# Patient Record
Sex: Female | Born: 1960
Health system: Southern US, Community
[De-identification: ages and names within clinical notes are randomized; demographics above are authoritative.]

## PROBLEM LIST (undated history)

## (undated) ENCOUNTER — Ambulatory Visit: Disposition: A | Discharge status: Home / Self Care | Payer: BC Managed Care – PPO

## (undated) DIAGNOSIS — I10 Essential (primary) hypertension: Secondary | ICD-10-CM

## (undated) DIAGNOSIS — R630 Anorexia: Secondary | ICD-10-CM

## (undated) DIAGNOSIS — E785 Hyperlipidemia, unspecified: Secondary | ICD-10-CM

## (undated) DIAGNOSIS — E079 Disorder of thyroid, unspecified: Secondary | ICD-10-CM

## (undated) DIAGNOSIS — R55 Syncope and collapse: Secondary | ICD-10-CM

## (undated) HISTORY — DX: Anorexia: R63.0

## (undated) HISTORY — DX: Hyperlipidemia, unspecified: E78.5

## (undated) HISTORY — DX: Syncope and collapse: R55

---

## 1986-08-27 DIAGNOSIS — R630 Anorexia: Secondary | ICD-10-CM

## 1986-08-27 HISTORY — DX: Anorexia: R63.0

## 1987-08-28 HISTORY — PX: CHOLECYSTECTOMY: SHX55

## 2004-06-06 ENCOUNTER — Emergency Department: Payer: Self-pay | Admitting: Emergency Medicine

## 2004-10-06 ENCOUNTER — Ambulatory Visit: Payer: Self-pay

## 2005-03-15 ENCOUNTER — Ambulatory Visit: Payer: Self-pay | Admitting: Internal Medicine

## 2005-10-10 ENCOUNTER — Ambulatory Visit: Payer: Self-pay | Admitting: Unknown Physician Specialty

## 2005-11-07 ENCOUNTER — Ambulatory Visit: Payer: Self-pay

## 2006-05-15 ENCOUNTER — Ambulatory Visit: Payer: Self-pay | Admitting: Internal Medicine

## 2008-07-04 ENCOUNTER — Emergency Department: Payer: Self-pay | Admitting: Internal Medicine

## 2008-08-17 ENCOUNTER — Ambulatory Visit: Payer: Self-pay | Admitting: Internal Medicine

## 2008-11-01 ENCOUNTER — Emergency Department: Payer: Self-pay | Admitting: Emergency Medicine

## 2009-07-11 ENCOUNTER — Ambulatory Visit: Payer: Self-pay | Admitting: Internal Medicine

## 2009-07-13 ENCOUNTER — Ambulatory Visit: Payer: Self-pay | Admitting: Internal Medicine

## 2009-07-16 ENCOUNTER — Ambulatory Visit: Payer: Self-pay | Admitting: Specialist

## 2009-08-10 ENCOUNTER — Ambulatory Visit: Payer: Self-pay | Admitting: Specialist

## 2009-08-25 ENCOUNTER — Ambulatory Visit: Payer: Self-pay | Admitting: Specialist

## 2010-08-27 HISTORY — PX: ABDOMINAL HYSTERECTOMY: SHX81

## 2010-08-27 HISTORY — PX: ROTATOR CUFF REPAIR: SHX139

## 2010-10-12 ENCOUNTER — Ambulatory Visit: Payer: Self-pay | Admitting: Internal Medicine

## 2010-10-20 ENCOUNTER — Ambulatory Visit: Payer: Self-pay | Admitting: Internal Medicine

## 2010-11-09 ENCOUNTER — Emergency Department: Payer: Self-pay | Admitting: Emergency Medicine

## 2010-11-26 ENCOUNTER — Ambulatory Visit: Payer: Self-pay | Admitting: Internal Medicine

## 2010-11-28 ENCOUNTER — Emergency Department: Payer: Self-pay | Admitting: Emergency Medicine

## 2010-11-30 ENCOUNTER — Ambulatory Visit: Payer: Self-pay

## 2010-12-07 ENCOUNTER — Ambulatory Visit: Payer: Self-pay

## 2010-12-27 ENCOUNTER — Emergency Department: Payer: Self-pay | Admitting: Emergency Medicine

## 2012-03-11 ENCOUNTER — Ambulatory Visit: Payer: Self-pay | Admitting: Family Medicine

## 2012-06-30 ENCOUNTER — Ambulatory Visit: Payer: Self-pay | Admitting: Specialist

## 2012-07-04 ENCOUNTER — Ambulatory Visit: Payer: Self-pay | Admitting: Specialist

## 2013-03-12 ENCOUNTER — Ambulatory Visit: Payer: Self-pay | Admitting: Family Medicine

## 2014-08-27 DIAGNOSIS — R55 Syncope and collapse: Secondary | ICD-10-CM

## 2014-08-27 HISTORY — DX: Syncope and collapse: R55

## 2014-12-14 NOTE — Op Note (Signed)
PATIENT NAME:  Allison Coffey, Allison Coffey MR#:  [id number] DATE OF BIRTH:  Aug 09, 1961  DATE OF PROCEDURE:  07/04/2012  PREOPERATIVE DIAGNOSIS: Severe degenerative arthritis, first metacarpocarpal joint, left thumb.   POSTOPERATIVE DIAGNOSIS: Severe degenerative arthritis, first metacarpocarpal joint, left thumb.   PROCEDURE PERFORMED: Excisional trapezium arthroplasty, base of left thumb.   SURGEON: Myra Rudehristopher Arryana Tolleson, MD  ANESTHESIA: General.   COMPLICATIONS: None.   TOURNIQUET TIME: Approximately 60 minutes.   DESCRIPTION OF PROCEDURE: One gram of Ancef was given intravenously prior to the procedure. General anesthesia is induced. The left upper extremity is thoroughly prepped with alcohol and ChloraPrep and draped in standard sterile fashion. The extremity is wrapped out with the Esmarch bandage and pneumatic tourniquet elevated to 250 mmHg. Under loupe magnification, standard dorsal longitudinal incision is made over the base of the thumb, at the first metacarpocarpal joint. The dissection is carefully carried down to the dorsal joint capsule with preservation of the tendons and cutaneous nerves which are retracted to each side. The capsule is then incised longitudinally and carefully reflected off of the bone and preserved. The trapezium is then outlined using the freer elevator and using the bur and rongeur is completely excised. The wound is thoroughly irrigated multiple times. Three small pieces of Gelfoam are then compressed into the shape of a trapezium and sewn together with 4-0 Mersilene. This was then sewn into the base of the wound and the tendon and pushed into the empty space and secured. Capsule is then meticulously repaired dorsally using 4-0 Mersilene. The skin edges are      infiltrated with 0.5% plain Marcaine. The skin is closed with a running subcuticular 3-0 Prolene. A soft bulky dressing is applied with a thumb spica splint. The tourniquet is released and the patient is returned  to the recovery room in satisfactory condition having tolerated the procedure quite well.  ____________________________ Clare Gandyhristopher E. Adekunle Rohrbach, MD ces:slb D: 07/04/2012 09:15:46 ET T: 07/04/2012 11:21:01 ET JOB#: [id number]  cc: Clare Gandyhristopher E. Zerick Prevette, MD, <Dictator> Clare GandyHRISTOPHER E Aundria Bitterman MD ELECTRONICALLY SIGNED 07/08/2012 9:45

## 2015-01-19 ENCOUNTER — Ambulatory Visit (INDEPENDENT_AMBULATORY_CARE_PROVIDER_SITE_OTHER): Payer: 59 | Admitting: Internal Medicine

## 2015-01-19 ENCOUNTER — Encounter: Payer: Self-pay | Admitting: Internal Medicine

## 2015-01-19 VITALS — BP 126/78 | HR 55 | Temp 97.7°F | Resp 14 | Ht 64.5 in | Wt 143.5 lb

## 2015-01-19 DIAGNOSIS — T730XXS Starvation, sequela: Secondary | ICD-10-CM

## 2015-01-19 DIAGNOSIS — F5102 Adjustment insomnia: Secondary | ICD-10-CM

## 2015-01-19 DIAGNOSIS — Z1159 Encounter for screening for other viral diseases: Secondary | ICD-10-CM

## 2015-01-19 DIAGNOSIS — E038 Other specified hypothyroidism: Secondary | ICD-10-CM | POA: Diagnosis not present

## 2015-01-19 DIAGNOSIS — E46 Unspecified protein-calorie malnutrition: Secondary | ICD-10-CM

## 2015-01-19 DIAGNOSIS — E034 Atrophy of thyroid (acquired): Secondary | ICD-10-CM

## 2015-01-19 DIAGNOSIS — F509 Eating disorder, unspecified: Secondary | ICD-10-CM

## 2015-01-19 DIAGNOSIS — F5001 Anorexia nervosa, restricting type: Secondary | ICD-10-CM

## 2015-01-19 NOTE — Progress Notes (Signed)
Pre-visit discussion using our clinic review tool. No additional management support is needed unless otherwise documented below in the visit note.  

## 2015-01-19 NOTE — Patient Instructions (Signed)
You need to intake calories every 3 hours while awake.  You are in starvation mode currently the way you have been managing your diet  Stick with Nichelle's eating plan.  Drink a Insurance account managerremier Protein shake if you do't feel hungry  Melatonin 5 mg with dinner  Glass of wine with dinner   Stop the laxatives,  Ok to use metamucil or similar bulk forming laxative
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
## 2015-01-20 LAB — COMPREHENSIVE METABOLIC PANEL
ALBUMIN: 4.2 g/dL (ref 3.5–5.2)
ALT: 16 U/L (ref 0–35)
AST: 23 U/L (ref 0–37)
Alkaline Phosphatase: 52 U/L (ref 39–117)
BUN: 16 mg/dL (ref 6–23)
CALCIUM: 9 mg/dL (ref 8.4–10.5)
CHLORIDE: 102 meq/L (ref 96–112)
CO2: 28 mEq/L (ref 19–32)
Creatinine, Ser: 0.76 mg/dL (ref 0.40–1.20)
GFR: 84.28 mL/min (ref >60.00)
Glucose, Bld: 78 mg/dL (ref 70–99)
Potassium: 4.1 mEq/L (ref 3.5–5.1)
SODIUM: 136 meq/L (ref 135–145)
Total Bilirubin: 0.6 mg/dL (ref 0.2–1.2)
Total Protein: 6.6 g/dL (ref 6.0–8.3)

## 2015-01-20 LAB — LIPID PANEL
CHOLESTEROL: 199 mg/dL (ref 0–200)
HDL: 94.8 mg/dL (ref >39.00)
LDL CALC: 92 mg/dL (ref 0–99)
NonHDL: 104.2
TRIGLYCERIDES: 63 mg/dL (ref 0.0–149.0)
Total CHOL/HDL Ratio: 2
VLDL: 12.6 mg/dL (ref 0.0–40.0)

## 2015-01-20 LAB — TSH: TSH: 2.37 u[IU]/mL (ref 0.35–4.50)

## 2015-01-20 LAB — CBC WITH DIFFERENTIAL/PLATELET
Basophils Absolute: 0 10*3/uL (ref 0.0–0.1)
Basophils Relative: 0.4 % (ref 0.0–3.0)
EOS ABS: 0.2 10*3/uL (ref 0.0–0.7)
EOS PCT: 3 % (ref 0.0–5.0)
HCT: 43.7 % (ref 36.0–46.0)
Hemoglobin: 14.6 g/dL (ref 12.0–15.0)
Lymphocytes Relative: 23.4 % (ref 12.0–46.0)
Lymphs Abs: 1.7 10*3/uL (ref 0.7–4.0)
MCHC: 33.4 g/dL (ref 30.0–36.0)
MCV: 96.8 fl (ref 78.0–100.0)
Monocytes Absolute: 0.5 10*3/uL (ref 0.1–1.0)
Monocytes Relative: 6.8 % (ref 3.0–12.0)
Neutro Abs: 4.8 10*3/uL (ref 1.4–7.7)
Neutrophils Relative %: 66.4 % (ref 43.0–77.0)
PLATELETS: 225 10*3/uL (ref 150.0–400.0)
RBC: 4.51 Mil/uL (ref 3.87–5.11)
RDW: 13.9 % (ref 11.5–15.5)
WBC: 7.3 10*3/uL (ref 4.0–10.5)

## 2015-01-20 LAB — FOLATE RBC: RBC Folate: 675 ng/mL (ref >280)

## 2015-01-20 LAB — HEPATITIS C ANTIBODY: HCV Ab: NEGATIVE

## 2015-01-20 LAB — VITAMIN B12: VITAMIN B 12: 264 pg/mL (ref 211–911)

## 2015-01-23 ENCOUNTER — Encounter: Payer: Self-pay | Admitting: Internal Medicine

## 2015-01-23 DIAGNOSIS — F50014 Anorexia nervosa, restricting type, in remission: Secondary | ICD-10-CM | POA: Insufficient documentation

## 2015-01-23 DIAGNOSIS — F509 Eating disorder, unspecified: Secondary | ICD-10-CM | POA: Insufficient documentation

## 2015-01-23 DIAGNOSIS — F5001 Anorexia nervosa, restricting type: Secondary | ICD-10-CM | POA: Insufficient documentation

## 2015-01-23 DIAGNOSIS — E039 Hypothyroidism, unspecified: Secondary | ICD-10-CM | POA: Insufficient documentation

## 2015-01-23 DIAGNOSIS — F5102 Adjustment insomnia: Secondary | ICD-10-CM | POA: Insufficient documentation

## 2015-01-23 NOTE — Assessment & Plan Note (Signed)
Long discussion with patient about her history ,  Current symptoms and signs , ongoing laxative abuse, etc.  Recommendations made to increaese caloric intake to at least 800 mg dail,  Stop fasting and , discontinue the stimulant laxative abuse

## 2015-01-23 NOTE — Assessment & Plan Note (Signed)
.  Thyroid function is WNL on current dose.  No current changes needed.   Lab Results  Component Value Date   TSH 2.37 01/19/2015

## 2015-01-23 NOTE — Assessment & Plan Note (Signed)
Chronic, no previous trial of melatonin.  Advised to try melatonin and encouragged to have a glass of wine with dinner

## 2015-01-23 NOTE — Assessment & Plan Note (Addendum)
Self induced from her unacknowledged eating disorder.  She has been refusing to accept the diagnosis ,  And I am the 3rd or 4th physician  Who has advised her in the last 2 years .  Long discussion today about her caloric needs, how her goal BMI is not unreasonable  But her method is both ineffective and unsafe.  Lab Results  Component Value Date   ALT 16 01/19/2015   AST 23 01/19/2015   ALKPHOS 52 01/19/2015   BILITOT 0.6 01/19/2015

## 2015-01-24 ENCOUNTER — Telehealth: Payer: Self-pay | Admitting: Internal Medicine

## 2015-01-24 NOTE — Telephone Encounter (Signed)
My chart message sent to patient re low carb diet

## 2015-02-11 NOTE — Telephone Encounter (Signed)
Mailed unread message to pt  

## 2015-02-14 ENCOUNTER — Encounter: Payer: Self-pay | Admitting: Internal Medicine

## 2015-02-14 DIAGNOSIS — R140 Abdominal distension (gaseous): Secondary | ICD-10-CM

## 2015-02-24 ENCOUNTER — Ambulatory Visit
Admission: RE | Admit: 2015-02-24 | Discharge: 2015-02-24 | Disposition: A | Discharge status: Home / Self Care | Payer: 59 | Source: Ambulatory Visit | Attending: Internal Medicine | Admitting: Internal Medicine

## 2015-02-24 DIAGNOSIS — R140 Abdominal distension (gaseous): Secondary | ICD-10-CM | POA: Insufficient documentation

## 2015-02-24 MED ORDER — TECHNETIUM TC 99M SULFUR COLLOID
2.0000 | Freq: Once | INTRAVENOUS | Status: AC | PRN
  Administered 2015-02-24: 2.175 via INTRAVENOUS

## 2015-03-28 ENCOUNTER — Encounter: Payer: Self-pay | Admitting: Internal Medicine

## 2015-05-18 ENCOUNTER — Telehealth: Payer: Self-pay | Admitting: Internal Medicine

## 2015-05-18 MED ORDER — PREDNISONE 10 MG PO TABS: ORAL_TABLET | ORAL | Status: DC

## 2015-05-18 MED ORDER — LEVOFLOXACIN 500 MG PO TABS: 500.0000 mg | ORAL_TABLET | Freq: Every day | ORAL | Status: DC

## 2015-05-18 NOTE — Telephone Encounter (Signed)
I am treating you for sinusitis   I am prescribing an antibiotic (levaquin) and a prednisone taper  To manage the infection and the inflammation in your ear/sinuses.   I also advise use of the following OTC meds to help with your other symptoms.   Take generic OTC benadryl 25 mg every 8 hours for the drainage,  Sudafed PE  10 to 30 mg every 8 hours for the congestion, you may substitute Afrin nasal spray for the nighttime dose of sudafed PE  If needed to prevent insomnia.  flush your sinuses twice daily with Lloyd Huger Meds sinus rinse (do over the sink because if you do it right you will spit out globs of mucus)  Gargle with salt water as needed for sore throat.     Please take a probiotic ( Align, Floraque or Culturelle) of the generic version of one of these  For a minimum of 3 weeks to prevent a serious antibiotic associated diarrhea  Called clostridium dificile colitis

## 2015-07-27 ENCOUNTER — Ambulatory Visit (INDEPENDENT_AMBULATORY_CARE_PROVIDER_SITE_OTHER): Payer: 59 | Admitting: Internal Medicine

## 2015-07-27 VITALS — BP 124/72 | HR 77 | Temp 98.0°F | Wt 140.0 lb

## 2015-07-27 DIAGNOSIS — F5001 Anorexia nervosa, restricting type: Secondary | ICD-10-CM

## 2015-07-27 DIAGNOSIS — E038 Other specified hypothyroidism: Secondary | ICD-10-CM

## 2015-07-27 DIAGNOSIS — F5000 Anorexia nervosa, unspecified: Secondary | ICD-10-CM

## 2015-07-27 DIAGNOSIS — E034 Atrophy of thyroid (acquired): Secondary | ICD-10-CM | POA: Diagnosis not present

## 2015-07-27 MED ORDER — ESZOPICLONE 2 MG PO TABS: 2.0000 mg | ORAL_TABLET | Freq: Every evening | ORAL | Status: DC | PRN

## 2015-07-27 MED ORDER — LEVOTHYROXINE SODIUM 75 MCG PO TABS: 75.0000 ug | ORAL_TABLET | Freq: Every day | ORAL | Status: DC

## 2015-07-27 MED ORDER — ESTROGENS CONJUGATED 0.3 MG PO TABS: 0.3000 mg | ORAL_TABLET | Freq: Every day | ORAL | Status: DC

## 2015-07-27 NOTE — Patient Instructions (Signed)
Eszopiclone tablets What is this medicine? ESZOPICLONE (es ZOE pi clone) is used to treat insomnia. This medicine helps you to fall asleep and sleep through the night. This medicine may be used for other purposes; ask your health care provider or pharmacist if you have questions. What should I tell my health care provider before I take this medicine? They need to know if you have any of these conditions: -depression -history of a drug or alcohol abuse problem -liver disease -lung or breathing disease -suicidal thoughts -an unusual or allergic reaction to eszopiclone, other medicines, foods, dyes, or preservatives -pregnant or trying to get pregnant -breast-feeding How should I use this medicine? Take this medicine by mouth with a glass of water. Follow the directions on the prescription label. It is better to take this medicine on an empty stomach and only when you are ready for bed. Do not take your medicine more often than directed. If you have been taking this medicine for several weeks and suddenly stop taking it, you may get unpleasant withdrawal symptoms. Your doctor or health care professional may want to gradually reduce the dose. Do not stop taking this medicine on your own. Always follow your doctor or health care professional's advice. Talk to your pediatrician regarding the use of this medicine in children. Special care may be needed. Overdosage: If you think you have taken too much of this medicine contact a poison control center or emergency room at once. NOTE: This medicine is only for you. Do not share this medicine with others. What if I miss a dose? This does not apply. This medicine should only be taken immediately before going to sleep. Do not take double or extra doses. What may interact with this medicine? -herbal medicines like kava kava, melatonin, St. John's wort and valerian -lorazepam -medicines for fungal infections like ketoconazole, fluconazole, or  itraconazole -olanzapine This list may not describe all possible interactions. Give your health care provider a list of all the medicines, herbs, non-prescription drugs, or dietary supplements you use. Also tell them if you smoke, drink alcohol, or use illegal drugs. Some items may interact with your medicine. What should I watch for while using this medicine? Visit your doctor or health care professional for regular checks on your progress. Keep a regular sleep schedule by going to bed at about the same time nightly. Avoid caffeine-containing drinks in the evening hours, as caffeine can cause trouble with falling asleep. Talk to your doctor if you still have trouble sleeping. After taking this medicine for sleep, you may get up out of bed while not being fully awake and do an activity that you do not know you are doing. The next morning, you may have no memory of the event. Activities such as driving a car ("sleep-driving"), making and eating food, talking on the phone, sexual activity, and sleep-walking have been reported. Call your doctor right away if you find out you have done any of these activities. Do not take this medicine if you have used alcohol that evening or before bed or taken another medicine for sleep, since your risk of doing these sleep-related activities will be increased. Do not take this medicine unless you are able to stay in bed for a full night (7 to 8 hours) before you must be active again. You may have a decrease in mental alertness the day after use, even if you feel that you are fully awake. Tell your doctor if you will need to perform activities requiring full   alertness, such as driving, the next day. Do not stand or sit up quickly after taking this medicine, especially if you are an older patient. This reduces the risk of dizzy or fainting spells. If you or your family notice any changes in your behavior, such as new or worsening depression, thoughts of harming yourself,  anxiety, other unusual or disturbing thoughts, or memory loss, call your doctor right away. After you stop taking this medicine, you may have trouble falling asleep. This is called rebound insomnia. This problem usually goes away on its own after 1 or 2 nights. What side effects may I notice from receiving this medicine? Side effects that you should report to your doctor or health care professional as soon as possible: -allergic reactions like skin rash, itching or hives, swelling of the face, lips, or tongue -changes in vision -confusion -depressed mood -feeling faint or lightheaded, falls -hallucinations -problems with balance, speaking, walking -restlessness, excitability, or feelings of agitation -unusual activities while asleep like driving, eating, making phone calls Side effects that usually do not require medical attention (report to your doctor or health care professional if they continue or are bothersome): -dizziness, or daytime drowsiness, sometimes called a hangover effect -headache This list may not describe all possible side effects. Call your doctor for medical advice about side effects. You may report side effects to FDA at 1-800-FDA-1088. Where should I keep my medicine? Keep out of the reach of children. This medicine can be abused. Keep your medicine in a safe place to protect it from theft. Do not share this medicine with anyone. Selling or giving away this medicine is dangerous and against the law. This medicine may cause accidental overdose and death if taken by other adults, children, or pets. Mix any unused medicine with a substance like cat litter or coffee grounds. Then throw the medicine away in a sealed container like a sealed bag or a coffee can with a lid. Do not use the medicine after the expiration date. Store at room temperature between 15 and 30 degrees C (59 and 86 degrees F). NOTE: This sheet is a summary. It may not cover all possible information. If you  have questions about this medicine, talk to your doctor, pharmacist, or health care provider.    2016, Elsevier/Gold Standard. (2014-05-04 15:22:01)  

## 2015-07-27 NOTE — Progress Notes (Signed)
Subjective:  Patient ID: Allison Coffey, female    DOB: 09/19/1960  Age: 54 y.o. MRN: [id number]  CC: The primary encounter diagnosis was Anorexia nervosa. Diagnoses of Anorexia nervosa,restricting type, in partial remission, severe and Hypothyroidism due to acquired atrophy of thyroid were also pertinent to this visit.  HPI Allison Coffey presents for follow up on anorexia nervosa. She was last seen  On May 25, as a new patient, at which time she had requested appetite suppressants to help her lose weight.  She had been told by other physicians priro toher visit that she had an eating disorder and was advsied by me of the same.  She had been restricting her daily caloric intake to < 500 calories and was concerned about a recurrent symptoms of abdominal distension and bloating after minimal intake of food.   She was sent for a gastric emptying study which was normal. She was advised to change her diet , specifically to increase her food intake to 3 meals daily (formerly eating only once daily) .   She is accompanied by her daughter, who agrees that her eating habits, although somewhat improved ,  Are still abnormal.  She has lost 3 lbs since her last visit.   Outpatient Prescriptions Prior to Visit  Medication Sig Dispense Refill  . Probiotic Product (PROBIOTIC ACIDOPHILUS) CAPS Take 1 capsule by mouth daily.    Marland Kitchen. levofloxacin (LEVAQUIN) 500 MG tablet Take 1 tablet (500 mg total) by mouth daily. (Patient not taking: Reported on 07/27/2015) 7 tablet 0  . levothyroxine (SYNTHROID, LEVOTHROID) 75 MCG tablet 75 mcg daily before breakfast.     . predniSONE (DELTASONE) 10 MG tablet 6 tablets on Day 1 , then reduce by 1 tablet daily until gone (Patient not taking: Reported on 07/27/2015) 21 tablet 0   No facility-administered medications prior to visit.    Review of Systems;  Patient denies headache, fevers, malaise, unintentional weight loss, skin rash, eye pain, sinus congestion and sinus  pain, sore throat, dysphagia,  hemoptysis , cough, dyspnea, wheezing, chest pain, palpitations, orthopnea, edema, abdominal pain, nausea, melena, diarrhea, constipation, flank pain, dysuria, hematuria, urinary  Frequency, nocturia, numbness, tingling, seizures,  Focal weakness, Loss of consciousness,  Tremor, insomnia, depression, anxiety, and suicidal ideation.      Objective:  BP 124/72 mmHg  Pulse 77  Temp(Src) 98 F (36.7 C) (Oral)  Wt 140 lb (63.504 kg)  SpO2 98%  BP Readings from Last 3 Encounters:  07/27/15 124/72  01/19/15 126/78    Wt Readings from Last 3 Encounters:  07/27/15 140 lb (63.504 kg)  01/19/15 143 lb 8 oz (65.091 kg)    General appearance: alert, cooperative and appears stated age Ears: normal TM's and external ear canals both ears Throat: lips, mucosa, and tongue normal; teeth and gums normal Neck: no adenopathy, no carotid bruit, supple, symmetrical, trachea midline and thyroid not enlarged, symmetric, no tenderness/mass/nodules Back: symmetric, no curvature. ROM normal. No CVA tenderness. Lungs: clear to auscultation bilaterally Heart: regular rate and rhythm, S1, S2 normal, no murmur, click, rub or gallop Abdomen: soft, non-tender; bowel sounds normal; no masses,  no organomegaly Pulses: 2+ and symmetric Skin: Skin color, texture, turgor normal. No rashes or lesions Lymph nodes: Cervical, supraclavicular, and axillary nodes normal.  No results found for: HGBA1C  Lab Results  Component Value Date   CREATININE 0.76 01/19/2015    Lab Results  Component Value Date   WBC 7.3 01/19/2015   HGB 14.6 01/19/2015  HCT 43.7 01/19/2015   PLT 225.0 01/19/2015   GLUCOSE 78 01/19/2015   CHOL 199 01/19/2015   TRIG 63.0 01/19/2015   HDL 94.80 01/19/2015   LDLCALC 92 01/19/2015   ALT 16 01/19/2015   AST 23 01/19/2015   NA 136 01/19/2015   K 4.1 01/19/2015   CL 102 01/19/2015   CREATININE 0.76 01/19/2015   BUN 16 01/19/2015   CO2 28 01/19/2015   TSH  2.37 01/19/2015    Nm Gastric Emptying  02/24/2015  CLINICAL DATA:  Postprandial distension EXAM: NUCLEAR MEDICINE GASTRIC EMPTYING SCAN TECHNIQUE: After oral ingestion of radiolabeled meal, sequential abdominal images were obtained for 4 hours. Percentage of activity emptying the stomach was calculated at 1 hour, 2 hour, 3 hour, and 4 hours. RADIOPHARMACEUTICALS:  2.175 mCi mCi Tc-36m MDP labeled sulfur colloid in egg orally COMPARISON:  None. FINDINGS: Expected location of the stomach in the left upper quadrant. Ingested meal empties the stomach gradually over the course of the study. 36% emptied at 1 hr ( normal >= 10%) 66% emptied at 2 hr ( normal >= 40%) 87% emptied at 3 hr ( normal >= 70%) 96% emptied at 4 hr ( normal >= 90%) IMPRESSION: Normal gastric emptying study for solid material. Electronically Signed   By: Bretta Bang III M.D.   On: 02/24/2015 11:57    Assessment & Plan:   Problem List Items Addressed This Visit    Anorexia nervosa,restricting type, in partial remission, severe    She is losing weight despite eating more frequently because she is still restricting her calories.  She acknowledges that she has had an eating disorder and is interested in receiving counselling as she is miserable. Referral to [facility] eating disorders clinic.        Hypothyroidism    Thyroid function is WNL on current dose.  No current changes needed.   Lab Results  Component Value Date   TSH 2.37 01/19/2015         Relevant Medications   levothyroxine (SYNTHROID, LEVOTHROID) 75 MCG tablet    Other Visit Diagnoses    Anorexia nervosa    -  Primary    Relevant Orders    Amb Referral to Nutrition and Diabetic E       I have discontinued Ms. Stroh's predniSONE and levofloxacin. I have also changed her levothyroxine. Additionally, I am having her start on estrogens (conjugated) and eszopiclone. Lastly, I am having her maintain her PROBIOTIC ACIDOPHILUS.  Meds ordered this encounter    Medications  . estrogens, conjugated, (PREMARIN) 0.3 MG tablet    Sig: Take 1 tablet (0.3 mg total) by mouth daily.    Dispense:  30 tablet    Refill:  0  . levothyroxine (SYNTHROID, LEVOTHROID) 75 MCG tablet    Sig: Take 1 tablet (75 mcg total) by mouth daily before breakfast.    Dispense:  90 tablet    Refill:  1  . eszopiclone (LUNESTA) 2 MG TABS tablet    Sig: Take 1 tablet (2 mg total) by mouth at bedtime as needed for sleep. Take immediately before bedtime    Dispense:  30 tablet    Refill:  0    Medications Discontinued During This Encounter  Medication Reason  . levothyroxine (SYNTHROID, LEVOTHROID) 75 MCG tablet Reorder  . predniSONE (DELTASONE) 10 MG tablet   . levofloxacin (LEVAQUIN) 500 MG tablet    A total of 25 minutes of face to face time was spent with patient more than  half of which was spent in counselling about the above mentioned conditions  and coordination of care   Follow-up: No Follow-up on file.   Sherlene Shams, MD

## 2015-07-29 ENCOUNTER — Encounter: Payer: Self-pay | Admitting: Internal Medicine

## 2015-07-29 NOTE — Assessment & Plan Note (Signed)
She is losing weight despite eating more frequently because she is still restricting her calories.  She acknowledges that she has had an eating disorder and is interested in receiving counselling as she is miserable. [facility]

## 2015-07-29 NOTE — Assessment & Plan Note (Signed)
Thyroid function is WNL on current dose.  No current changes needed.   Lab Results  Component Value Date   TSH 2.37 01/19/2015

## 2015-08-15 ENCOUNTER — Ambulatory Visit (INDEPENDENT_AMBULATORY_CARE_PROVIDER_SITE_OTHER): Payer: 59 | Admitting: Internal Medicine

## 2015-08-15 ENCOUNTER — Telehealth: Payer: Self-pay | Admitting: Internal Medicine

## 2015-08-15 ENCOUNTER — Encounter: Payer: Self-pay | Admitting: Internal Medicine

## 2015-08-15 VITALS — BP 150/88 | HR 64 | Temp 97.9°F | Resp 14

## 2015-08-15 DIAGNOSIS — J0100 Acute maxillary sinusitis, unspecified: Secondary | ICD-10-CM | POA: Diagnosis not present

## 2015-08-15 DIAGNOSIS — R509 Fever, unspecified: Secondary | ICD-10-CM | POA: Diagnosis not present

## 2015-08-15 LAB — POCT INFLUENZA A/B
Influenza A, POC: NEGATIVE
Influenza B, POC: NEGATIVE

## 2015-08-15 MED ORDER — PROMETHAZINE HCL 12.5 MG PO TABS
12.5000 mg | ORAL_TABLET | Freq: Three times a day (TID) | ORAL | Status: DC | PRN
Start: 2015-08-15 — End: 2015-12-07

## 2015-08-15 MED ORDER — PREDNISONE 10 MG PO TABS: ORAL_TABLET | ORAL | Status: DC

## 2015-08-15 MED ORDER — LEVOFLOXACIN 500 MG PO TABS: 500.0000 mg | ORAL_TABLET | Freq: Every day | ORAL | Status: DC

## 2015-08-15 NOTE — Patient Instructions (Addendum)
I am treating you for bacterial sinusitis which is a complication from your viral infection due to  persistent sinus congestion.   I am prescribing an antibiotic (levaquin) and a prednisone taper  To manage the infection and the inflammation in your ear/sinuses.   I also advise use of the following OTC meds to help with your other symptoms.   Take generic OTC benadryl 25 mg every 8 hours for the drainage,  Sudafed if needed for congestion,   flush your sinuses twice daily with NeilMed sinus rinse  (do over the sink because if you do it right you will spit out globs of mucus)  Use Robitussin or Delsym   FOR THE DAYTIME COUGH.  Promethazine is needed for nausea,  It is sedating but less $$$ than Zofran.   Please take a probiotic ( Align, Floraque or Culturelle) OR A GENERIC EQUIVALENT for three weeks since you are taking an  antibiotic to prevent a very serious antibiotic associated infection  Called closttidium dificile colitis that can cause diarrhea, multi organ failure, sepsis and death if not managed.   I would plan on staying out of work until Wednesday as the earliest return date.

## 2015-08-15 NOTE — Telephone Encounter (Signed)
Patient scheduled for today. Dr. Darrick Huntsmanullo

## 2015-08-15 NOTE — Telephone Encounter (Signed)
Can you fit her in this morning with another provider for sinus infection?

## 2015-08-15 NOTE — Progress Notes (Signed)
Subjective:  Patient ID: Allison Coffey, female    DOB: 03-02-1961  Age: 54 y.o. MRN: [id number]  CC: The primary encounter diagnosis was Fever, unspecified. A diagnosis of Acute maxillary sinusitis, recurrence not specified was also pertinent to this visit.  HPI Allison Coffey presents for sinusitis  Symptoms of sinus congestion started  5 days ago.  She started using sudafed, ibuprofen and robitussin.  On Saturday developed headache and dizziness, flushed,  Presyncopal .  Yesterday was too nauseated to eat but drank Pedialyte .  This am drank a Boost  Has had a flu shot.    Outpatient Prescriptions Prior to Visit  Medication Sig Dispense Refill  . estrogens, conjugated, (PREMARIN) 0.3 MG tablet Take 1 tablet (0.3 mg total) by mouth daily. 30 tablet 0  . eszopiclone (LUNESTA) 2 MG TABS tablet Take 1 tablet (2 mg total) by mouth at bedtime as needed for sleep. Take immediately before bedtime 30 tablet 0  . levothyroxine (SYNTHROID, LEVOTHROID) 75 MCG tablet Take 1 tablet (75 mcg total) by mouth daily before breakfast. 90 tablet 1  . Probiotic Product (PROBIOTIC ACIDOPHILUS) CAPS Take 1 capsule by mouth daily. Reported on 08/15/2015     No facility-administered medications prior to visit.    Review of Systems;  Patient denies headache, fevers, malaise, unintentional weight loss, skin rash, eye pain, sinus congestion and sinus pain, sore throat, dysphagia,  hemoptysis , cough, dyspnea, wheezing, chest pain, palpitations, orthopnea, edema, abdominal pain, nausea, melena, diarrhea, constipation, flank pain, dysuria, hematuria, urinary  Frequency, nocturia, numbness, tingling, seizures,  Focal weakness, Loss of consciousness,  Tremor, insomnia, depression, anxiety, and suicidal ideation.      Objective:  BP 150/88 mmHg  Pulse 64  Temp(Src) 97.9 F (36.6 C) (Oral)  Resp 14  SpO2 98%  BP Readings from Last 3 Encounters:  08/15/15 150/88  07/27/15 124/72  01/19/15 126/78     Wt Readings from Last 3 Encounters:  07/27/15 140 lb (63.504 kg)  01/19/15 143 lb 8 oz (65.091 kg)    General appearance: alert, cooperative and appears stated age HEENT: Head: Normocephalic, without obvious abnormality, atraumatic, sinuses tender to percussion Eyes: conjunctivae/corneas clear. PERRL, EOM's intact. Fundi benign. Ears: normal TM's and external ear canals both ears Nose: Nares normal. Septum midline. Mucosa injected and red . Maxillary  sinus tenderness bilateral, no crusting or bleeding points Throat: lips, mucosa, and tongue normal; teeth and gums normal Lungs: clear to auscultation bilaterally Heart: regular rate and rhythm, S1, S2 normal, no murmur, click, rub or gallop Abdomen: soft, non-tender; bowel sounds normal; no masses,  no organomegaly Pulses: 2+ and symmetric Skin: Skin color, texture, turgor normal. No rashes or lesions Lymph nodes: Cervical, supraclavicular, and axillary nodes normal.  No results found for: HGBA1C  Lab Results  Component Value Date   CREATININE 0.76 01/19/2015    Lab Results  Component Value Date   WBC 7.3 01/19/2015   HGB 14.6 01/19/2015   HCT 43.7 01/19/2015   PLT 225.0 01/19/2015   GLUCOSE 78 01/19/2015   CHOL 199 01/19/2015   TRIG 63.0 01/19/2015   HDL 94.80 01/19/2015   LDLCALC 92 01/19/2015   ALT 16 01/19/2015   AST 23 01/19/2015   NA 136 01/19/2015   K 4.1 01/19/2015   CL 102 01/19/2015   CREATININE 0.76 01/19/2015   BUN 16 01/19/2015   CO2 28 01/19/2015   TSH 2.37 01/19/2015    Nm Gastric Emptying  02/24/2015  CLINICAL DATA:  Postprandial  distension EXAM: NUCLEAR MEDICINE GASTRIC EMPTYING SCAN TECHNIQUE: After oral ingestion of radiolabeled meal, sequential abdominal images were obtained for 4 hours. Percentage of activity emptying the stomach was calculated at 1 hour, 2 hour, 3 hour, and 4 hours. RADIOPHARMACEUTICALS:  2.175 mCi mCi Tc-6163m MDP labeled sulfur colloid in egg orally COMPARISON:  None.  FINDINGS: Expected location of the stomach in the left upper quadrant. Ingested meal empties the stomach gradually over the course of the study. 36% emptied at 1 hr ( normal >= 10%) 66% emptied at 2 hr ( normal >= 40%) 87% emptied at 3 hr ( normal >= 70%) 96% emptied at 4 hr ( normal >= 90%) IMPRESSION: Normal gastric emptying study for solid material. Electronically Signed   By: Bretta BangWilliam  Woodruff III M.D.   On: 02/24/2015 11:57    Assessment & Plan:   Problem List Items Addressed This Visit    Sinusitis, acute    Given chronicity of symptoms, development of facial pain and exam consistent with bacterial URI,  Will treat with empiric antibiotics, steroids, decongestants, and saline lavage.        Relevant Medications   phenylephrine (SUDAFED PE) 10 MG TABS tablet   Pseudoephedrine-DM-GG (ROBITUSSIN COLD & COUGH PO)   levofloxacin (LEVAQUIN) 500 MG tablet   promethazine (PHENERGAN) 12.5 MG tablet   predniSONE (DELTASONE) 10 MG tablet    Other Visit Diagnoses    Fever, unspecified    -  Primary    Relevant Orders    POCT Influenza A/B (Completed)       I am having Ms. Kauk start on levofloxacin and promethazine. I am also having her maintain her PROBIOTIC ACIDOPHILUS, estrogens (conjugated), levothyroxine, eszopiclone, phenylephrine, Pseudoephedrine-DM-GG (ROBITUSSIN COLD & COUGH PO), and predniSONE.  Meds ordered this encounter  Medications  . phenylephrine (SUDAFED PE) 10 MG TABS tablet    Sig: Take 10 mg by mouth every 4 (four) hours as needed.  . Pseudoephedrine-DM-GG (ROBITUSSIN COLD & COUGH PO)    Sig: Take 5 mLs by mouth every 6 (six) hours as needed.  Marland Kitchen. DISCONTD: predniSONE (DELTASONE) 10 MG tablet    Sig: 6 tablets on Day 1 , then reduce by 1 tablet daily until gone    Dispense:  21 tablet    Refill:  0  . levofloxacin (LEVAQUIN) 500 MG tablet    Sig: Take 1 tablet (500 mg total) by mouth daily.    Dispense:  7 tablet    Refill:  0  . promethazine (PHENERGAN) 12.5 MG  tablet    Sig: Take 1 tablet (12.5 mg total) by mouth every 8 (eight) hours as needed for nausea or vomiting.    Dispense:  20 tablet    Refill:  0  . predniSONE (DELTASONE) 10 MG tablet    Sig: 6 tablets on Day 1 , then reduce by 1 tablet daily until gone    Dispense:  21 tablet    Refill:  0    Medications Discontinued During This Encounter  Medication Reason  . predniSONE (DELTASONE) 10 MG tablet Reorder    Follow-up: No Follow-up on file.   Sherlene ShamsULLO, Odetta Forness L, MD

## 2015-08-16 DIAGNOSIS — J019 Acute sinusitis, unspecified: Secondary | ICD-10-CM | POA: Insufficient documentation

## 2015-08-16 NOTE — Assessment & Plan Note (Signed)
Given chronicity of symptoms, development of facial pain and exam consistent with bacterial URI,  Will treat with empiric antibiotics, steroids, decongestants, and saline lavage.  

## 2015-08-24 ENCOUNTER — Other Ambulatory Visit: Payer: Self-pay | Admitting: Internal Medicine

## 2015-08-24 NOTE — Telephone Encounter (Signed)
Prescription started in novermber with no refills, please advise for refill? Was this a trial?

## 2015-08-24 NOTE — Telephone Encounter (Signed)
Ok to refill,  Refill sent  

## 2015-11-14 ENCOUNTER — Encounter: Payer: Self-pay | Admitting: Internal Medicine

## 2015-12-07 ENCOUNTER — Ambulatory Visit (INDEPENDENT_AMBULATORY_CARE_PROVIDER_SITE_OTHER): Payer: Commercial Managed Care - HMO | Admitting: Internal Medicine

## 2015-12-07 ENCOUNTER — Other Ambulatory Visit (HOSPITAL_COMMUNITY)
Admission: RE | Admit: 2015-12-07 | Discharge: 2015-12-07 | Disposition: A | Discharge status: Home / Self Care | Payer: Commercial Managed Care - HMO | Source: Ambulatory Visit | Attending: Internal Medicine | Admitting: Internal Medicine

## 2015-12-07 ENCOUNTER — Encounter: Payer: Self-pay | Admitting: Internal Medicine

## 2015-12-07 VITALS — BP 124/82 | HR 73 | Temp 97.7°F | Resp 12 | Ht 64.0 in | Wt 148.8 lb

## 2015-12-07 DIAGNOSIS — Z1239 Encounter for other screening for malignant neoplasm of breast: Secondary | ICD-10-CM

## 2015-12-07 DIAGNOSIS — Z01411 Encounter for gynecological examination (general) (routine) with abnormal findings: Secondary | ICD-10-CM | POA: Insufficient documentation

## 2015-12-07 DIAGNOSIS — Z23 Encounter for immunization: Secondary | ICD-10-CM

## 2015-12-07 DIAGNOSIS — Z1151 Encounter for screening for human papillomavirus (HPV): Secondary | ICD-10-CM | POA: Insufficient documentation

## 2015-12-07 DIAGNOSIS — Z124 Encounter for screening for malignant neoplasm of cervix: Secondary | ICD-10-CM

## 2015-12-07 DIAGNOSIS — Z90711 Acquired absence of uterus with remaining cervical stump: Secondary | ICD-10-CM | POA: Diagnosis not present

## 2015-12-07 DIAGNOSIS — F5001 Anorexia nervosa, restricting type: Secondary | ICD-10-CM

## 2015-12-07 DIAGNOSIS — Z0000 Encounter for general adult medical examination without abnormal findings: Secondary | ICD-10-CM | POA: Diagnosis not present

## 2015-12-07 NOTE — Patient Instructions (Signed)

## 2015-12-07 NOTE — Progress Notes (Signed)
Pre-visit discussion using our clinic review tool. No additional management support is needed unless otherwise documented below in the visit note.  

## 2015-12-07 NOTE — Progress Notes (Signed)
Patient ID: Allison Coffey, female    DOB: 06-Sep-1960  Age: 55 y.o. MRN: [id number]  The patient is here for annual  wellness examination and management of other chronic and acute problems.   Last PAP over 5 years  ago,  Still has cervix PAP done today  Last mammogram 2014  Last colonoscopy over 5 years ago  No FH or polyps    Referred to [place] [facility] in late nOvember   The risk factors are reflected in the social history.  The roster of all physicians providing medical care to patient - is listed in the Snapshot section of the chart.  Activities of daily living:  The patient is 100% independent in all ADLs: dressing, toileting, feeding as well as independent mobility  Home safety : The patient has smoke detectors in the home. They wear seatbelts.  There are no firearms at home. There is no violence in the home.   There is no risks for hepatitis, STDs or HIV. There is no   history of blood transfusion. They have no travel history to infectious disease endemic areas of the world.  The patient has seen their dentist in the last six month. They have seen their eye doctor in the last year. They admit to slight hearing difficulty with regard to whispered voices and some television programs.  They have deferred audiologic testing in the last year.  They do not  have excessive sun exposure. Discussed the need for sun protection: hats, long sleeves and use of sunscreen if there is significant sun exposure.   Diet: the importance of a healthy diet is discussed. They do have a healthy diet.  The benefits of regular aerobic exercise were discussed. She walks 4 times per week ,  20 minutes.   Depression screen: there are no signs or vegative symptoms of depression- irritability, change in appetite, anhedonia, sadness/tearfullness.  Cognitive assessment: the patient manages all their financial and personal affairs and is actively engaged. They could relate day,date,year and events; recalled  2/3 objects at 3 minutes; performed clock-face test normally.  The following portions of the patient's history were reviewed and updated as appropriate: allergies, current medications, past family history, past medical history,  past surgical history, past social history  and problem list.  Visual acuity was not assessed per patient preference since she has regular follow up with her ophthalmologist. Hearing and body mass index were assessed and reviewed.   During the course of the visit the patient was educated and counseled about appropriate screening and preventive services including : fall prevention , diabetes screening, nutrition counseling, colorectal cancer screening, and recommended immunizations.    CC: The primary encounter diagnosis was S/P abdominal supracervical subtotal hysterectomy. Diagnoses of Cervical cancer screening, Breast cancer screening, Need for prophylactic vaccination with combined diphtheria-tetanus-pertussis (DTP) vaccine, Anorexia nervosa,restricting type, in partial remission, severe, and Visit for preventive health examination were also pertinent to this visit.   Disappointed at weight gain.  Exercising 6 days/week Still restricting calories to < 1000. Eating a Banana for breakfast    History Allison Coffey has a past medical history of Hyperlipidemia; Syncope (y-4); and Anorexia (1988).   She has past surgical history that includes Cholecystectomy (1989); Abdominal hysterectomy (2012); and Rotator cuff repair (Left, 2012).   Her family history includes Heart disease in her maternal grandmother, paternal grandfather, and paternal grandmother; Heart disease (age of onset: 60) in her mother; Stroke in her maternal grandmother; Stroke (age of onset: 70) in her mother.She  reports that she quit smoking about 20 years ago. She has never used smokeless tobacco. She reports that she drinks alcohol. She reports that she does not use illicit drugs.  Outpatient Prescriptions  Prior to Visit  Medication Sig Dispense Refill  . levothyroxine (SYNTHROID, LEVOTHROID) 75 MCG tablet Take 1 tablet (75 mcg total) by mouth daily before breakfast. 90 tablet 1  . eszopiclone (LUNESTA) 2 MG TABS tablet Take 1 tablet (2 mg total) by mouth at bedtime as needed for sleep. Take immediately before bedtime (Patient not taking: Reported on 12/07/2015) 30 tablet 0  . levofloxacin (LEVAQUIN) 500 MG tablet Take 1 tablet (500 mg total) by mouth daily. 7 tablet 0  . phenylephrine (SUDAFED PE) 10 MG TABS tablet Take 10 mg by mouth every 4 (four) hours as needed.    . predniSONE (DELTASONE) 10 MG tablet 6 tablets on Day 1 , then reduce by 1 tablet daily until gone 21 tablet 0  . PREMARIN 0.3 MG tablet TAKE 1 TABLET (0.3 MG TOTAL) BY MOUTH DAILY. 30 tablet 5  . Probiotic Product (PROBIOTIC ACIDOPHILUS) CAPS Take 1 capsule by mouth daily. Reported on 08/15/2015    . promethazine (PHENERGAN) 12.5 MG tablet Take 1 tablet (12.5 mg total) by mouth every 8 (eight) hours as needed for nausea or vomiting. 20 tablet 0  . Pseudoephedrine-DM-GG (ROBITUSSIN COLD & COUGH PO) Take 5 mLs by mouth every 6 (six) hours as needed.     No facility-administered medications prior to visit.    Review of Systems   Patient denies headache, fevers, malaise, unintentional weight loss, skin rash, eye pain, sinus congestion and sinus pain, sore throat, dysphagia,  hemoptysis , cough, dyspnea, wheezing, chest pain, palpitations, orthopnea, edema, abdominal pain, nausea, melena, diarrhea, constipation, flank pain, dysuria, hematuria, urinary  Frequency, nocturia, numbness, tingling, seizures,  Focal weakness, Loss of consciousness,  Tremor, insomnia, depression, anxiety, and suicidal ideation.      Objective:  BP 124/82 mmHg  Pulse 73  Temp(Src) 97.7 F (36.5 C) (Oral)  Resp 12  Ht  (1.626 m)  Wt 148 lb 12 oz (67.473 kg)  BMI 25.52 kg/m2  SpO2 98%  Physical Exam   General Appearance:    Alert, cooperative,  no distress, appears stated age  Head:    Normocephalic, without obvious abnormality, atraumatic  Eyes:    PERRL, conjunctiva/corneas clear, EOM's intact, fundi    benign, both eyes  Ears:    Normal TM's and external ear canals, both ears  Nose:   Nares normal, septum midline, mucosa normal, no drainage    or sinus tenderness  Throat:   Lips, mucosa, and tongue normal; teeth and gums normal  Neck:   Supple, symmetrical, trachea midline, no adenopathy;    thyroid:  no enlargement/tenderness/nodules; no carotid   bruit or JVD  Back:     Symmetric, no curvature, ROM normal, no CVA tenderness  Lungs:     Clear to auscultation bilaterally, respirations unlabored  Chest Wall:    No tenderness or deformity   Heart:    Regular rate and rhythm, S1 and S2 normal, no murmur, rub   or gallop  Breast Exam:    No tenderness, masses, or nipple abnormality  Abdomen:     Soft, non-tender, bowel sounds active all four quadrants,    no masses, no organomegaly  Genitalia:    Pelvic: cervix normal in appearance, external genitalia normal, no adnexal masses or tenderness, no cervical motion tenderness, rectovaginal septum normal, uterus  surgically absent  and vagina normal without discharge  Extremities:   Extremities normal, atraumatic, no cyanosis or edema  Pulses:   2+ and symmetric all extremities  Skin:   Skin color, texture, turgor normal, no rashes or lesions  Lymph nodes:   Cervical, supraclavicular, and axillary nodes normal  Neurologic:   CNII-XII intact, normal strength, sensation and reflexes    throughout    .    Assessment & Plan:   Problem List Items Addressed This Visit    Anorexia nervosa,restricting type, in partial remission, severe    [facility] and patient never received a call.  She is very interested in getting help for her eating disorder.       Visit for preventive health examination    Annual wellness  exam was done as well as  a comprehensive physical exam and management of acute and chronic conditions .  During the course of the visit the patient was educated and counseled about appropriate screening and preventive services including :  diabetes screening, lipid analysis with projected  10 year  risk for CAD , nutrition counseling, colorectal cancer screening, and recommended immunizations.  Printed recommendations for health maintenance screenings was given.       S/P abdominal supracervical subtotal hysterectomy - Primary    Other Visit Diagnoses    Cervical cancer screening        Relevant Orders    Cytology - PAP (Completed)    Breast cancer screening        Relevant Orders    MM DIGITAL SCREENING BILATERAL    Need for prophylactic vaccination with combined diphtheria-tetanus-pertussis (DTP) vaccine        Relevant Orders    Tdap vaccine greater than or equal to 7yo IM (Completed)       I have discontinued Ms. Stuber's PROBIOTIC ACIDOPHILUS BIOBEADS, phenylephrine, Pseudoephedrine-DM-GG (ROBITUSSIN COLD & COUGH PO), levofloxacin, promethazine, predniSONE, and PREMARIN. I am also having her maintain her levothyroxine and eszopiclone.  No orders of the defined types were placed in this encounter.    Medications Discontinued During This Encounter  Medication Reason  . levofloxacin (LEVAQUIN) 500 MG tablet Completed Course  . phenylephrine (SUDAFED PE) 10 MG TABS tablet Completed Course  . predniSONE (DELTASONE) 10 MG tablet Completed Course  . promethazine (PHENERGAN) 12.5 MG tablet Completed Course  . PREMARIN 0.3 MG tablet Error  . Probiotic Product (PROBIOTIC ACIDOPHILUS) CAPS Error  . Pseudoephedrine-DM-GG (ROBITUSSIN COLD & COUGH PO) Error    Follow-up: No Follow-up on file.   Sherlene ShamsULLO, Ellyson Rarick L, MD

## 2015-12-08 LAB — CYTOLOGY - PAP

## 2015-12-10 DIAGNOSIS — Z0000 Encounter for general adult medical examination without abnormal findings: Secondary | ICD-10-CM | POA: Insufficient documentation

## 2015-12-10 NOTE — Assessment & Plan Note (Addendum)
[facility] and patient never received a call.  She is very interested in getting help for her eating disorder.

## 2015-12-10 NOTE — Assessment & Plan Note (Signed)

## 2015-12-11 ENCOUNTER — Encounter: Payer: Self-pay | Admitting: Internal Medicine

## 2016-01-27 ENCOUNTER — Encounter: Payer: Self-pay | Admitting: Family Medicine

## 2016-01-27 ENCOUNTER — Ambulatory Visit (INDEPENDENT_AMBULATORY_CARE_PROVIDER_SITE_OTHER): Payer: Commercial Managed Care - HMO | Admitting: Family Medicine

## 2016-01-27 VITALS — BP 176/103 | HR 67 | Temp 98.3°F | Wt 142.4 lb

## 2016-01-27 DIAGNOSIS — I10 Essential (primary) hypertension: Secondary | ICD-10-CM | POA: Diagnosis not present

## 2016-01-27 DIAGNOSIS — R42 Dizziness and giddiness: Secondary | ICD-10-CM

## 2016-01-27 LAB — COMPREHENSIVE METABOLIC PANEL
ALK PHOS: 66 U/L (ref 39–117)
ALT: 17 U/L (ref 0–35)
AST: 30 U/L (ref 0–37)
Albumin: 4.7 g/dL (ref 3.5–5.2)
BILIRUBIN TOTAL: 0.8 mg/dL (ref 0.2–1.2)
BUN: 18 mg/dL (ref 6–23)
CALCIUM: 10.2 mg/dL (ref 8.4–10.5)
CO2: 28 mEq/L (ref 19–32)
Chloride: 101 mEq/L (ref 96–112)
Creatinine, Ser: 0.71 mg/dL (ref 0.40–1.20)
GFR: 90.82 mL/min (ref >60.00)
Glucose, Bld: 96 mg/dL (ref 70–99)
POTASSIUM: 3.8 meq/L (ref 3.5–5.1)
Sodium: 139 mEq/L (ref 135–145)
Total Protein: 7.5 g/dL (ref 6.0–8.3)

## 2016-01-27 LAB — CBC
HCT: 44.3 % (ref 36.0–46.0)
HEMOGLOBIN: 15 g/dL (ref 12.0–15.0)
MCHC: 33.9 g/dL (ref 30.0–36.0)
MCV: 93.8 fl (ref 78.0–100.0)
PLATELETS: 292 10*3/uL (ref 150.0–400.0)
RBC: 4.72 Mil/uL (ref 3.87–5.11)
RDW: 13.7 % (ref 11.5–15.5)
WBC: 7.9 10*3/uL (ref 4.0–10.5)

## 2016-01-27 MED ORDER — AMLODIPINE BESYLATE 5 MG PO TABS: 5.0000 mg | ORAL_TABLET | Freq: Every day | ORAL | Status: DC

## 2016-01-27 NOTE — Patient Instructions (Signed)
Take the medication as prescribed.  Follow-up in one week.  Take care  Dr. Adriana Simasook

## 2016-01-27 NOTE — Assessment & Plan Note (Signed)
New problem. Documented elevated BP previously (prior encounter) and severely elevated today and on repeat. Labs today. Starting Norvasc. Follow up in 1 week.

## 2016-01-27 NOTE — Progress Notes (Signed)
Subjective:  Patient ID: Allison Coffey, female    DOB: 10/22/1960  Age: 55 y.o. MRN: [id number]  CC: Elevated BP, dizziness  HPI:  55 year old female with a known history of anorexia presents with the above complaints.  Patient states that she was at work [employer]) earlier this morning and began to not feel well. She had worked out earlier this morning. She states that she felt lightheaded and dizzy. Her blood pressure was checked and was found to be markedly elevated with systolic 976B. This was repeated multiple times. No recent chest pain, shortness of breath, vision changes. She does state that she didn't feel quite right. No known inciting factor. No exacerbating or relieving factors. Given her elevated blood pressure she thought it would be best to come in for evaluation.  Additionally, notably the patient has poor PO intake. She is currently still restricting. She eats less than 500 cal a day. She also works out 5 times a week.   Social Hx   Social History   Social History  . Marital Status: Married    Spouse Name: N/A  . Number of Children: N/A  . Years of Education: N/A   Social History Main Topics  . Smoking status: Former Smoker    Quit date: 01/19/1995  . Smokeless tobacco: Never Used  . Alcohol Use: 0.0 oz/week    0 Standard drinks or equivalent per week     Comment: occasionally  . Drug Use: No  . Sexual Activity: Yes   Other Topics Concern  . None   Social History Narrative   Review of Systems  Cardiovascular:       Elevated BP.  Neurological: Positive for dizziness and light-headedness.   Objective:  BP 176/103 mmHg  Pulse 67  Temp(Src) 98.3 F (36.8 C) (Oral)  Wt 142 lb 6 oz (64.581 kg)  SpO2 100%  BP/Weight 01/27/2016 12/07/2015 34/19/3790  Systolic BP 240 973 532  Diastolic BP 992 82 88  Wt. (Lbs) 142.38 148.75 -  BMI 24.43 25.52 -   Physical Exam  Constitutional: She is oriented to person, place, and time. She appears well-developed. No  distress.  Cardiovascular: Normal rate and regular rhythm.   Pulmonary/Chest: Effort normal. She has no wheezes. She has no rales.  Neurological: She is alert and oriented to person, place, and time.  No focal deficits.  Psychiatric: She has a normal mood and affect.  Vitals reviewed.  Lab Results  Component Value Date   WBC 7.3 01/19/2015   HGB 14.6 01/19/2015   HCT 43.7 01/19/2015   PLT 225.0 01/19/2015   GLUCOSE 78 01/19/2015   CHOL 199 01/19/2015   TRIG 63.0 01/19/2015   HDL 94.80 01/19/2015   LDLCALC 92 01/19/2015   ALT 16 01/19/2015   AST 23 01/19/2015   NA 136 01/19/2015   K 4.1 01/19/2015   CL 102 01/19/2015   CREATININE 0.76 01/19/2015   BUN 16 01/19/2015   CO2 28 01/19/2015   TSH 2.37 01/19/2015   Assessment & Plan:   Problem List Items Addressed This Visit    Hypertension - Primary    New problem. Documented elevated BP previously (prior encounter) and severely elevated today and on repeat. Labs today. Starting Norvasc. Follow up in 1 week.       Relevant Medications   amLODipine (NORVASC) 5 MG tablet    Other Visit Diagnoses    Dizziness        Relevant Orders    Comp Met (CMET)  CBC       Meds ordered this encounter  Medications  . amLODipine (NORVASC) 5 MG tablet    Sig: Take 1 tablet (5 mg total) by mouth daily.    Dispense:  90 tablet    Refill:  0    Follow-up: Return in about 1 week (around 02/03/2016) for HTN follow up.  [place]

## 2016-01-30 ENCOUNTER — Ambulatory Visit: Payer: Commercial Managed Care - HMO | Admitting: Family Medicine

## 2016-01-30 ENCOUNTER — Emergency Department
Admission: EM | Admit: 2016-01-30 | Discharge: 2016-01-30 | Disposition: A | Discharge status: Home / Self Care | Payer: Commercial Managed Care - HMO | Attending: Emergency Medicine | Admitting: Emergency Medicine

## 2016-01-30 DIAGNOSIS — E785 Hyperlipidemia, unspecified: Secondary | ICD-10-CM | POA: Insufficient documentation

## 2016-01-30 DIAGNOSIS — R002 Palpitations: Secondary | ICD-10-CM

## 2016-01-30 DIAGNOSIS — R42 Dizziness and giddiness: Secondary | ICD-10-CM

## 2016-01-30 DIAGNOSIS — I10 Essential (primary) hypertension: Secondary | ICD-10-CM | POA: Insufficient documentation

## 2016-01-30 DIAGNOSIS — Z79899 Other long term (current) drug therapy: Secondary | ICD-10-CM | POA: Diagnosis not present

## 2016-01-30 DIAGNOSIS — G4700 Insomnia, unspecified: Secondary | ICD-10-CM | POA: Diagnosis not present

## 2016-01-30 DIAGNOSIS — R531 Weakness: Secondary | ICD-10-CM

## 2016-01-30 DIAGNOSIS — Z87891 Personal history of nicotine dependence: Secondary | ICD-10-CM | POA: Insufficient documentation

## 2016-01-30 DIAGNOSIS — E039 Hypothyroidism, unspecified: Secondary | ICD-10-CM | POA: Diagnosis not present

## 2016-01-30 HISTORY — DX: Essential (primary) hypertension: I10

## 2016-01-30 LAB — CBC
HEMATOCRIT: 46.1 % (ref 35.0–47.0)
Hemoglobin: 15.3 g/dL (ref 12.0–16.0)
MCH: 31.4 pg (ref 26.0–34.0)
MCHC: 33.3 g/dL (ref 32.0–36.0)
MCV: 94.4 fL (ref 80.0–100.0)
Platelets: 255 10*3/uL (ref 150–440)
RBC: 4.88 MIL/uL (ref 3.80–5.20)
RDW: 13.2 % (ref 11.5–14.5)
WBC: 6.8 10*3/uL (ref 3.6–11.0)

## 2016-01-30 LAB — URINALYSIS COMPLETE WITH MICROSCOPIC (ARMC ONLY)
BACTERIA UA: NONE SEEN
Bilirubin Urine: NEGATIVE
GLUCOSE, UA: NEGATIVE mg/dL
HGB URINE DIPSTICK: NEGATIVE
LEUKOCYTES UA: NEGATIVE
NITRITE: NEGATIVE
Protein, ur: NEGATIVE mg/dL
RBC / HPF: NONE SEEN RBC/hpf (ref 0–5)
SPECIFIC GRAVITY, URINE: 1.012 (ref 1.005–1.030)
Squamous Epithelial / LPF: NONE SEEN
pH: 7 (ref 5.0–8.0)

## 2016-01-30 LAB — BASIC METABOLIC PANEL
ANION GAP: 8 (ref 5–15)
BUN: 11 mg/dL (ref 6–20)
CALCIUM: 9.6 mg/dL (ref 8.9–10.3)
CO2: 25 mmol/L (ref 22–32)
Chloride: 105 mmol/L (ref 101–111)
Creatinine, Ser: 0.68 mg/dL (ref 0.44–1.00)
GFR calc Af Amer: >60 mL/min (ref >60)
GLUCOSE: 91 mg/dL (ref 65–99)
POTASSIUM: 3.7 mmol/L (ref 3.5–5.1)
SODIUM: 138 mmol/L (ref 135–145)

## 2016-01-30 LAB — GLUCOSE, CAPILLARY: GLUCOSE-CAPILLARY: 78 mg/dL (ref 65–99)

## 2016-01-30 MED ORDER — TRAZODONE HCL 50 MG PO TABS: 50.0000 mg | ORAL_TABLET | Freq: Every day | ORAL | Status: DC

## 2016-01-30 NOTE — ED Notes (Signed)
Pt states she was not feeling well on Friday and went to PCP and had HTN, states she was placed on amlodipine and continues to have the dizziness and now feeling lightheaded when standing and increased HR to 110 with standing.. States she has been nauseated over the weekend and has not eaten very well..Marland Kitchen

## 2016-01-30 NOTE — ED Provider Notes (Signed)
[facility] Emergency Department Provider Note  Time seen: 5:41 PM  I have reviewed the triage vital signs and the nursing notes.   HISTORY  Chief Complaint Palpitations and Hypertension    HPI Allison Coffey is a 55 y.o. female with a past medical history of anorexia, hyperlipidemia, hypertension presents the emergency department for high blood pressure, intermittent dizziness, lightheadedness, and overall not feeling well. According to the patient for the past 4 or 5 days she has not been feeling well. States she'll intermittently get lightheaded, feel palpitations, her heart rate below increase greater than 100 bpm. Patient stated 3 days ago she took her blood pressure nose elevated to 170 systolic, she saw her primary care doctor who started her on amlodipine. Patient noted since starting amlodipine she will intermittently get lightheaded/dizzy especially with standing, her heart rate will increase greater than 100 with standing. She states her blood pressure remains elevated. She also describes just not feeling well which she describes as intermittent nausea, generalized weakness and fatigue. Patient states for the past 5 days she has not been sleeping well and getting 2 or 3 hours of sleep per night. She states she has to wake up at 1:30 in the morning to get ready to be at work by 3:00 in the morning. Often times she does not go to bed until 10:00 at night. Denies any chest pain or trouble breathing. Denies any fever.     Past Medical History  Diagnosis Date  . Hyperlipidemia   . Syncope y-4  . Anorexia 1988  . Hypertension     Patient Active Problem List   Diagnosis Date Noted  . Hypertension 01/27/2016  . Visit for preventive health examination 12/10/2015  . S/P abdominal supracervical subtotal hysterectomy 12/07/2015  . Eating disorder 01/23/2015  . Anorexia nervosa,restricting type, in partial remission, severe 01/23/2015  . Hypothyroidism  01/23/2015    Past Surgical History  Procedure Laterality Date  . Cholecystectomy  1989  . Abdominal hysterectomy  2012    Uterus only.  . Rotator cuff repair Left 2012    Current Outpatient Rx  Name  Route  Sig  Dispense  Refill  . amLODipine (NORVASC) 5 MG tablet   Oral   Take 1 tablet (5 mg total) by mouth daily.   90 tablet   0   . eszopiclone (LUNESTA) 2 MG TABS tablet   Oral   Take 1 tablet (2 mg total) by mouth at bedtime as needed for sleep. Take immediately before bedtime Patient not taking: Reported on 12/07/2015   30 tablet   0   . levothyroxine (SYNTHROID, LEVOTHROID) 75 MCG tablet   Oral   Take 1 tablet (75 mcg total) by mouth daily before breakfast.   90 tablet   1     Allergies Review of patient's allergies indicates no known allergies.  Family History  Problem Relation Age of Onset  . Heart disease Mother 66  . Stroke Mother 95  . Stroke Maternal Grandmother   . Heart disease Maternal Grandmother   . Heart disease Paternal Grandmother   . Heart disease Paternal Grandfather     Social History Social History  Substance Use Topics  . Smoking status: Former Smoker    Quit date: 01/19/1995  . Smokeless tobacco: Never Used  . Alcohol Use: 0.0 oz/week    0 Standard drinks or equivalent per week     Comment: occasionally    Review of Systems Constitutional: Negative for fever. Cardiovascular:  Negative for chest pain. Respiratory: Negative for shortness of breath. Gastrointestinal: Negative for abdominal pain Genitourinary: Negative for dysuria. Neurological: Negative for headache 10-point ROS otherwise negative.  ____________________________________________   PHYSICAL EXAM:  VITAL SIGNS: ED Triage Vitals  Enc Vitals Group     BP 01/30/16 1559 157/91 mmHg     Pulse Rate 01/30/16 1559 73     Resp 01/30/16 1559 16     Temp 01/30/16 1559 98.2 F (36.8 C)     Temp Source 01/30/16 1559 Oral     SpO2 01/30/16 1559 99 %     Weight  01/30/16 1559 136 lb (61.689 kg)     Height 01/30/16 1559 5\' 4"  (1.626 m)     Head Cir --      Peak Flow --      Pain Score 01/30/16 1559 0     Pain Loc --      Pain Edu? --      Excl. in GC? --     Constitutional: Alert and oriented. Well appearing and in no distress. Eyes: Normal exam ENT   Head: Normocephalic and atraumatic.   Mouth/Throat: Mucous membranes are moist. Cardiovascular: Normal rate, regular rhythm. No murmu Respiratory: Normal respiratory effort without tachypnea nor retractions. Breath sounds are clear  Gastrointestinal: Soft and nontender. No distention. Musculoskeletal: Nontender with normal range of motion in all extremities. Neurologic:  Normal speech and language. No gross focal neurologic deficits  Skin:  Skin is warm, dry and intact.  Psychiatric: Mood and affect are normal.   ____________________________________________    EKG  EKG reviewed and interpreted by myself shows normal sinus rhythm at 76 bpm, narrow QRS, normal axis, normal intervals, no ST changes. Overall normal EKG.  ____________________________________________      INITIAL IMPRESSION / ASSESSMENT AND PLAN / ED COURSE  Pertinent labs & imaging results that were available during my care of the patient were reviewed by me and considered in my medical decision making (see chart for details).  The patient presents the emergency department with hypertension, not feeling well, intermittent lightheadedness. I believe a lot of the patient's symptoms are likely due to amlodipine such as getting dizzy/lightheaded upon standing, heart rate increasing greater than 100 upon standing. She states all the symptoms began after starting amlodipine on Friday. I discussed with the patient decreasing her dose to 2.5 mg daily for the next 2 weeks before restarting her full dose of 5 mg daily. I also believe a lot of the patient's symptoms are due to her sleep deprivation. I discussed with the patient a  trial of a sleep aid with 8 hours of sleep a night for at least 7 days to see how the patient is feeling. We will prescribe the patient trazodone to be used nightly for the next several weeks. Patient agreeable to this plan. Overall the patient's workup appears normal, her exam appears normal. We will have the patient follow up with primary care physician for recheck/reevaluation after a trial of the sleep aid.  ____________________________________________   FINAL CLINICAL IMPRESSION(S) / ED DIAGNOSES  Sleep deprivation Hypertension Generalized weakness Palpitations   Minna AntisKevin Gari Hartsell, MD 01/30/16 1746

## 2016-01-30 NOTE — Discharge Instructions (Signed)
As we discussed please take your sleep medication 30 minutes prior to going to bed. Please attempt to obtain 8 hours asleep each night. Drink plenty of fluids. Decrease her amlodipine to 2.5 mg once daily for the next 2 weeks before restarting 5 mg daily. Please follow-up with a primary care physician in the next week for recheck/reevaluation. Return to the emergency department for any acute or concerning symptoms.   Palpitations A palpitation is the feeling that your heartbeat is irregular. It may feel like your heart is fluttering or skipping a beat. It may also feel like your heart is beating faster than normal. This is usually not a serious problem. In some cases, you may need more medical tests. HOME CARE  Avoid:  Caffeine in coffee, tea, soft drinks, diet pills, and energy drinks.  Chocolate.  Alcohol.  Stop smoking if you smoke.  Reduce your stress and anxiety. Try:  A method that measures bodily functions so you can learn to control them (biofeedback).  Yoga.  Meditation.  Physical activity such as swimming, jogging, or walking.  Get plenty of rest and sleep. GET HELP IF:  Your fast or irregular heartbeat continues after 24 hours.  Your palpitations occur more often. GET HELP RIGHT AWAY IF:   You have chest pain.  You feel short of breath.  You have a very bad headache.  You feel dizzy or pass out (faint). MAKE SURE YOU:   Understand these instructions.  Will watch your condition.  Will get help right away if you are not doing well or get worse.   This information is not intended to replace advice given to you by your health care provider. Make sure you discuss any questions you have with your health care provider.   Document Released: 05/22/2008 Document Revised: 09/03/2014 Document Reviewed: 10/12/2011 Elsevier Interactive Patient Education 2016 Elsevier Inc.  Insomnia Insomnia is a sleep disorder that makes it difficult to fall asleep or to stay  asleep. Insomnia can cause tiredness (fatigue), low energy, difficulty concentrating, mood swings, and poor performance at work or school.  There are three different ways to classify insomnia:  Difficulty falling asleep.  Difficulty staying asleep.  Waking up too early in the morning. Any type of insomnia can be long-term (chronic) or short-term (acute). Both are common. Short-term insomnia usually lasts for three months or less. Chronic insomnia occurs at least three times a week for longer than three months. CAUSES  Insomnia may be caused by another condition, situation, or substance, such as:  Anxiety.  Certain medicines.  Gastroesophageal reflux disease (GERD) or other gastrointestinal conditions.  Asthma or other breathing conditions.  Restless legs syndrome, sleep apnea, or other sleep disorders.  Chronic pain.  Menopause. This may include hot flashes.  Stroke.  Abuse of alcohol, tobacco, or illegal drugs.  Depression.  Caffeine.   Neurological disorders, such as Alzheimer disease.  An overactive thyroid (hyperthyroidism). The cause of insomnia may not be known. RISK FACTORS Risk factors for insomnia include:  Gender. Women are more commonly affected than men.  Age. Insomnia is more common as you get older.  Stress. This may involve your professional or personal life.  Income. Insomnia is more common in people with lower income.  Lack of exercise.   Irregular work schedule or night shifts.  Traveling between different time zones. SIGNS AND SYMPTOMS If you have insomnia, trouble falling asleep or trouble staying asleep is the main symptom. This may lead to other symptoms, such as:  Feeling  fatigued.  Feeling nervous about going to sleep.  Not feeling rested in the morning.  Having trouble concentrating.  Feeling irritable, anxious, or depressed. TREATMENT  Treatment for insomnia depends on the cause. If your insomnia is caused by an  underlying condition, treatment will focus on addressing the condition. Treatment may also include:   Medicines to help you sleep.  Counseling or therapy.  Lifestyle adjustments. HOME CARE INSTRUCTIONS   Take medicines only as directed by your health care provider.  Keep regular sleeping and waking hours. Avoid naps.  Keep a sleep diary to help you and your health care provider figure out what could be causing your insomnia. Include:   When you sleep.  When you wake up during the night.  How well you sleep.   How rested you feel the next day.  Any side effects of medicines you are taking.  What you eat and drink.   Make your bedroom a comfortable place where it is easy to fall asleep:  Put up shades or special blackout curtains to block light from outside.  Use a white noise machine to block noise.  Keep the temperature cool.   Exercise regularly as directed by your health care provider. Avoid exercising right before bedtime.  Use relaxation techniques to manage stress. Ask your health care provider to suggest some techniques that may work well for you. These may include:  Breathing exercises.  Routines to release muscle tension.  Visualizing peaceful scenes.  Cut back on alcohol, caffeinated beverages, and cigarettes, especially close to bedtime. These can disrupt your sleep.  Do not overeat or eat spicy foods right before bedtime. This can lead to digestive discomfort that can make it hard for you to sleep.  Limit screen use before bedtime. This includes:  Watching TV.  Using your smartphone, tablet, and computer.  Stick to a routine. This can help you fall asleep faster. Try to do a quiet activity, brush your teeth, and go to bed at the same time each night.  Get out of bed if you are still awake after 15 minutes of trying to sleep. Keep the lights down, but try reading or doing a quiet activity. When you feel sleepy, go back to bed.  Make sure that  you drive carefully. Avoid driving if you feel very sleepy.  Keep all follow-up appointments as directed by your health care provider. This is important. SEEK MEDICAL CARE IF:   You are tired throughout the day or have trouble in your daily routine due to sleepiness.  You continue to have sleep problems or your sleep problems get worse. SEEK IMMEDIATE MEDICAL CARE IF:   You have serious thoughts about hurting yourself or someone else.   This information is not intended to replace advice given to you by your health care provider. Make sure you discuss any questions you have with your health care provider.   Document Released: 08/10/2000 Document Revised: 05/04/2015 Document Reviewed: 05/14/2014 Elsevier Interactive Patient Education 2016 [employer].  Fatigue Fatigue is feeling tired all of the time, a lack of energy, or a lack of motivation. Occasional or mild fatigue is often a normal response to activity or life in general. However, long-lasting (chronic) or extreme fatigue may indicate an underlying medical condition. HOME CARE INSTRUCTIONS  Watch your fatigue for any changes. The following actions may help to lessen any discomfort you are feeling:  Talk to your health care provider about how much sleep you need each night. Try to get  the required amount every night.  Take medicines only as directed by your health care provider.  Eat a healthy and nutritious diet. Ask your health care provider if you need help changing your diet.  Drink enough fluid to keep your urine clear or pale yellow.  Practice ways of relaxing, such as yoga, meditation, massage therapy, or acupuncture.  Exercise regularly.   Change situations that cause you stress. Try to keep your work and personal routine reasonable.  Do not abuse illegal drugs.  Limit alcohol intake to no more than 1 drink per day for nonpregnant women and 2 drinks per day for men. One drink equals 12 ounces of beer, 5 ounces of  wine, or 1 ounces of hard liquor.  Take a multivitamin, if directed by your health care provider. SEEK MEDICAL CARE IF:   Your fatigue does not get better.  You have a fever.   You have unintentional weight loss or gain.  You have headaches.   You have difficulty:   Falling asleep.  Sleeping throughout the night.  You feel angry, guilty, anxious, or sad.   You are unable to have a bowel movement (constipation).   You skin is dry.   Your legs or another part of your body is swollen.  SEEK IMMEDIATE MEDICAL CARE IF:   You feel confused.   Your vision is blurry.  You feel faint or pass out.   You have a severe headache.   You have severe abdominal, pelvic, or back pain.   You have chest pain, shortness of breath, or an irregular or fast heartbeat.   You are unable to urinate or you urinate less than normal.   You develop abnormal bleeding, such as bleeding from the rectum, vagina, nose, lungs, or nipples.  You vomit blood.   You have thoughts about harming yourself or committing suicide.   You are worried that you might harm someone else.    This information is not intended to replace advice given to you by your health care provider. Make sure you discuss any questions you have with your health care provider.   Document Released: 06/10/2007 Document Revised: 09/03/2014 Document Reviewed: 12/15/2013 Elsevier Interactive Patient Education [employer]

## 2016-02-02 ENCOUNTER — Encounter: Payer: Self-pay | Admitting: Internal Medicine

## 2016-02-06 ENCOUNTER — Encounter: Payer: Self-pay | Admitting: Internal Medicine

## 2016-02-07 ENCOUNTER — Encounter: Payer: Self-pay | Admitting: Internal Medicine

## 2016-02-07 NOTE — Telephone Encounter (Signed)
Patient had an ED visit on 01/30/16 the appointment was to follow up on the ED visit. I see patient has been corresponding with MD does patient need to keep appointment on 02/29/16?

## 2016-02-09 ENCOUNTER — Telehealth: Payer: Self-pay | Admitting: Internal Medicine

## 2016-02-09 NOTE — Telephone Encounter (Signed)
She does not need the July 5 ER follow up appt

## 2016-02-09 NOTE — Telephone Encounter (Signed)
Appointment cancelled

## 2016-02-17 ENCOUNTER — Encounter: Payer: Self-pay | Admitting: Internal Medicine

## 2016-02-17 ENCOUNTER — Other Ambulatory Visit: Payer: Self-pay | Admitting: Internal Medicine

## 2016-02-17 DIAGNOSIS — F5105 Insomnia due to other mental disorder: Principal | ICD-10-CM

## 2016-02-17 DIAGNOSIS — F419 Anxiety disorder, unspecified: Secondary | ICD-10-CM

## 2016-02-17 MED ORDER — ALPRAZOLAM 0.5 MG PO TABS: 0.5000 mg | ORAL_TABLET | Freq: Every evening | ORAL | Status: DC | PRN

## 2016-02-17 NOTE — Assessment & Plan Note (Signed)
unsucessful trials of Lunesta and trazodone.  Alprazolam 0.5 mg prescribed

## 2016-02-20 ENCOUNTER — Encounter: Payer: Self-pay | Admitting: Internal Medicine

## 2016-02-22 ENCOUNTER — Encounter: Payer: Self-pay | Admitting: Internal Medicine

## 2016-02-29 ENCOUNTER — Ambulatory Visit: Payer: Commercial Managed Care - HMO | Admitting: Internal Medicine

## 2016-03-02 ENCOUNTER — Other Ambulatory Visit: Payer: Self-pay | Admitting: Internal Medicine

## 2016-03-02 MED ORDER — HYDROCHLOROTHIAZIDE 12.5 MG PO TABS: 12.5000 mg | ORAL_TABLET | Freq: Every day | ORAL | Status: DC

## 2016-03-02 MED ORDER — AMLODIPINE BESYLATE 10 MG PO TABS: 10.0000 mg | ORAL_TABLET | Freq: Every day | ORAL | Status: DC

## 2016-03-06 ENCOUNTER — Encounter: Payer: Self-pay | Admitting: Internal Medicine

## 2016-03-08 ENCOUNTER — Encounter: Payer: Self-pay | Admitting: Internal Medicine

## 2016-03-08 ENCOUNTER — Other Ambulatory Visit: Payer: Self-pay | Admitting: Internal Medicine

## 2016-03-08 DIAGNOSIS — I10 Essential (primary) hypertension: Secondary | ICD-10-CM

## 2016-03-12 MED ORDER — LOSARTAN POTASSIUM 50 MG PO TABS: 50.0000 mg | ORAL_TABLET | Freq: Every day | ORAL | Status: DC

## 2016-03-12 NOTE — Telephone Encounter (Signed)
patient sent me a text on my personal phone because she said her Mychart e mail was not working.    The test was about her persistently elevated BP and [facility] at [facility].  I can't find any [facility] documentation so it must have been a different clinic. There were too may errors in the text for me to figure out what medication  They treated her with,.  I cannot  Continue to change  her medications again without seeing her, because I think anxiety is playing a big part. .  I will add losartan 50 mg ,  Which I have sent to her pharmacy .  Continue the amlodipine and hctz as well

## 2016-03-12 NOTE — Telephone Encounter (Signed)
Spoke with the patient, she will come tomorrow and pick up the specimen container, thanks

## 2016-03-12 NOTE — Telephone Encounter (Signed)
Spoke with the patient, she is taking both the amlodopine and the HCTZ.  She has been getting headaches so [facility], they treated her more for the headache, then the BP as they think it was contributing to the increased BP, They did a ECG, Blood work and CT scan. All came back normal.   Reviewed your note with her, she will add the Losartan .  BP about an hour ago was 142/90 with the two other meds. thanks

## 2016-03-13 ENCOUNTER — Telehealth: Payer: Self-pay | Admitting: *Deleted

## 2016-03-13 NOTE — Telephone Encounter (Signed)
Yes she stilll needs to do it

## 2016-03-13 NOTE — Telephone Encounter (Signed)
I had talked with her yesterday and she had agreed to come in for the specimen container today, please advise based on her comment if you still would like it done, thanks

## 2016-03-13 NOTE — Telephone Encounter (Signed)
Pt stated that she was seen in the ER this weekend, where she had a CT scan, blood work, EKG and a urin specimen. She questioned if she would still need to complete the 24 hour urine sample.

## 2016-03-13 NOTE — Telephone Encounter (Signed)
Perfect she just picked it up, thanks

## 2016-03-14 ENCOUNTER — Other Ambulatory Visit: Payer: Commercial Managed Care - HMO

## 2016-03-14 DIAGNOSIS — I10 Essential (primary) hypertension: Secondary | ICD-10-CM

## 2016-03-15 ENCOUNTER — Encounter: Payer: Self-pay | Admitting: Internal Medicine

## 2016-03-15 ENCOUNTER — Other Ambulatory Visit: Payer: Self-pay | Admitting: Internal Medicine

## 2016-03-15 MED ORDER — DIAZEPAM 5 MG PO TABS: 5.0000 mg | ORAL_TABLET | Freq: Two times a day (BID) | ORAL | Status: DC | PRN

## 2016-03-15 NOTE — Telephone Encounter (Signed)
Patient has a scheduled folow up 04/13/16 would you like for me to make one sooner.

## 2016-03-16 ENCOUNTER — Encounter: Payer: Self-pay | Admitting: Internal Medicine

## 2016-03-18 ENCOUNTER — Encounter: Payer: Self-pay | Admitting: Internal Medicine

## 2016-03-18 LAB — CATECHOLAMINES, FRACTIONATED, URINE, 24 HOUR
CALCULATED TOTAL (E+ NE): 72 ug/(24.h) (ref 26–121)
CREATININE, URINE MG/DAY-CATEUR: 1.17 g/(24.h) (ref 0.63–2.50)
Dopamine, 24 hr Urine: 274 mcg/24 h (ref 52–480)
Epinephrine, 24 hr Urine: 14 mcg/24 h (ref 2–24)
NOREPINEPHRINE, 24 HR UR: 58 ug/(24.h) (ref 15–100)
Total Volume - CF 24Hr U: 2700 mL

## 2016-03-18 LAB — METANEPHRINES, URINE, 24 HOUR
Metaneph Total, Ur: 335 mcg/24 h (ref 224–832)
Metanephrines, Ur: 108 mcg/24 h (ref 90–315)
NORMETANEPHRINE 24H UR: 227 ug/(24.h) (ref 122–676)

## 2016-03-27 ENCOUNTER — Encounter: Payer: Self-pay | Admitting: Internal Medicine

## 2016-04-13 ENCOUNTER — Encounter: Payer: Self-pay | Admitting: Internal Medicine

## 2016-04-13 ENCOUNTER — Ambulatory Visit (INDEPENDENT_AMBULATORY_CARE_PROVIDER_SITE_OTHER): Payer: Commercial Managed Care - HMO | Admitting: Internal Medicine

## 2016-04-13 DIAGNOSIS — F411 Generalized anxiety disorder: Secondary | ICD-10-CM

## 2016-04-13 DIAGNOSIS — I10 Essential (primary) hypertension: Secondary | ICD-10-CM | POA: Diagnosis not present

## 2016-04-13 DIAGNOSIS — F5001 Anorexia nervosa, restricting type: Secondary | ICD-10-CM

## 2016-04-13 MED ORDER — LOSARTAN POTASSIUM-HCTZ 50-12.5 MG PO TABS: 1.0000 | ORAL_TABLET | Freq: Every day | ORAL | 3 refills | Status: DC

## 2016-04-13 MED ORDER — SERTRALINE HCL 25 MG PO TABS: 25.0000 mg | ORAL_TABLET | Freq: Every day | ORAL | 1 refills | Status: DC

## 2016-04-13 NOTE — Patient Instructions (Addendum)
We are starting generic zoloft at 12.5 mg (1/2 tablet) for the first 4 days . Take it in the evening with a meal.    Increase your dose to a full tablet if tolerated after a few days ,  And stay on 25 mg for two weeks  increase dose to 50 mg total after two  Weeks  if tolerating the 25 mg dose  Use the alprazolam or the valium when you cannot control your thoughts/anxiety .     We are combining the losartan and hctz into one pill when you need a refill.  Here are the names of several well respected female therapists that I recommend seeing to help you control your anxiety :   Montel ClockKatherine Wagner    [phone]  [place]   [phone]   Valerie Padgett [phone]  [place]  [phone]  [place]

## 2016-04-13 NOTE — Progress Notes (Signed)
Subjective:  Patient ID: Allison Coffey, female    DOB: 05/03/1961  Age: 55 y.o. MRN: [id number]  CC: Diagnoses of Essential hypertension, Anorexia nervosa,restricting type, in partial remission, severe, and GAD (generalized anxiety disorder) were pertinent to this visit.  HPI Valentina ShaggyChristina M Wooden presents for follow up on multiple issues:  1)  hypertension aggravated by chronic generalized anxiety . She hHas had several ER visits for feeling bad, BP elevated during those evaluations..  Several weeks ago I Started her on daily valium after repeated titrations of her anti hypertensives failed to improve her readings.  She is now taking maximal does of amlodipine hctz and losartan . Home bps have improved for the last several weeks 139/87 .   GAD:  Using the valium once daily  For the last several day. Has been using 1/2 alprazolam at night for insomnia.  Cleans her house twice daily from top to bottom.  Daughter is with her today who corroborates her report. ,   3) Eating disorder:t still feels that she cannot stop obsessing at night about her weight and what she ate for dinner.  Still following  A restricted diet and having an exaggerated response to eating that includes abdominal distension lasting several hours.  She continues to abuse laxatives and takes dulcolax daily . Continues to argue that she does not have a disorder since she has a weight problem.  Reviewed her BMI today which is  Less than 25..       Outpatient Medications Prior to Visit  Medication Sig Dispense Refill  . ALPRAZolam (XANAX) 0.5 MG tablet Take 1 tablet (0.5 mg total) by mouth at bedtime as needed for anxiety. 30 tablet 3  . amLODipine (NORVASC) 10 MG tablet Take 1 tablet (10 mg total) by mouth daily. 30 tablet 5  . diazepam (VALIUM) 5 MG tablet Take 1 tablet (5 mg total) by mouth every 12 (twelve) hours as needed for anxiety. 30 tablet 1  . hydrochlorothiazide (HYDRODIURIL) 12.5 MG tablet Take 1 tablet (12.5 mg total)  by mouth daily. 30 tablet 5  . losartan (COZAAR) 50 MG tablet Take 1 tablet (50 mg total) by mouth daily. 30 tablet 3  . levothyroxine (SYNTHROID, LEVOTHROID) 75 MCG tablet Take 1 tablet (75 mcg total) by mouth daily before breakfast. 90 tablet 1   No facility-administered medications prior to visit.     Review of Systems;  Patient denies headache, fevers, malaise, unintentional weight loss, skin rash, eye pain, sinus congestion and sinus pain, sore throat, dysphagia,  hemoptysis , cough, dyspnea, wheezing, chest pain, palpitations, orthopnea, edema, abdominal pain, nausea, melena, diarrhea, constipation, flank pain, dysuria, hematuria, urinary  Frequency, nocturia, numbness, tingling, seizures,  Focal weakness, Loss of consciousness,  Tremor, insomnia, depression, anxiety, and suicidal ideation.      Objective:  BP 126/76   Pulse 67   Temp 98.1 F (36.7 C) (Oral)   Resp 12   Ht 5\' 4"  (1.626 m)   Wt 140 lb 8 oz (63.7 kg)   SpO2 98%   BMI 24.12 kg/m   BP Readings from Last 3 Encounters:  04/13/16 126/76  01/30/16 (!) 148/79  01/27/16 (!) 176/103    Wt Readings from Last 3 Encounters:  04/13/16 140 lb 8 oz (63.7 kg)  01/30/16 136 lb (61.7 kg)  01/27/16 142 lb 6 oz (64.6 kg)    General appearance: alert, cooperative and appears stated age Ears: normal TM's and external ear canals both ears Throat: lips, mucosa, and  tongue normal; teeth and gums normal Neck: no adenopathy, no carotid bruit, supple, symmetrical, trachea midline and thyroid not enlarged, symmetric, no tenderness/mass/nodules Back: symmetric, no curvature. ROM normal. No CVA tenderness. Lungs: clear to auscultation bilaterally Heart: regular rate and rhythm, S1, S2 normal, no murmur, click, rub or gallop Abdomen: soft, non-tender; bowel sounds normal; no masses,  no organomegaly Pulses: 2+ and symmetric Skin: Skin color, texture, turgor normal. No rashes or lesions Lymph nodes: Cervical, supraclavicular, and  axillary nodes normal.  No results found for: HGBA1C  Lab Results  Component Value Date   CREATININE 0.68 01/30/2016   CREATININE 0.71 01/27/2016   CREATININE 0.76 01/19/2015    Lab Results  Component Value Date   WBC 6.8 01/30/2016   HGB 15.3 01/30/2016   HCT 46.1 01/30/2016   PLT 255 01/30/2016   GLUCOSE 91 01/30/2016   CHOL 199 01/19/2015   TRIG 63.0 01/19/2015   HDL 94.80 01/19/2015   LDLCALC 92 01/19/2015   ALT 17 01/27/2016   AST 30 01/27/2016   NA 138 01/30/2016   K 3.7 01/30/2016   CL 105 01/30/2016   CREATININE 0.68 01/30/2016   BUN 11 01/30/2016   CO2 25 01/30/2016   TSH 2.37 01/19/2015    No results found.  Assessment & Plan:   Problem List Items Addressed This Visit    Anorexia nervosa,restricting type, in partial remission, severe    [facility] and inpatient admission was advised but deferred by patient due to husband's current unemployed status       Hypertension    Finally controlled with concurrent treatment for GAD. screening for pheochromocytoma was negative,  Done due to recurrent epsiodes of very elevated readings and accompanied by malaise .  Lab Results  Component Value Date   CREATININE 0.68 01/30/2016   Lab Results  Component Value Date   NA 138 01/30/2016   K 3.7 01/30/2016   CL 105 01/30/2016   CO2 25 01/30/2016         Relevant Medications   losartan-hydrochlorothiazide (HYZAAR) 50-12.5 MG tablet   GAD (generalized anxiety disorder)    adding zoloft starting at 12.5 mg daily,  Bi weekkly titration to 100 mg planned       Other Visit Diagnoses   None.    A total of 40 minutes was spent with patient more than half of which was spent in counseling patient on the above mentioned issues , reviewing and explaining recent labs and imaging studies done, and coordination of care.  I have discontinued Ms. Friesenhahn's hydrochlorothiazide and losartan. I am also having her start on sertraline and  losartan-hydrochlorothiazide. Additionally, I am having her maintain her levothyroxine, ALPRAZolam, amLODipine, and diazepam.  Meds ordered this encounter  Medications  . sertraline (ZOLOFT) 25 MG tablet    Sig: Take 1 tablet (25 mg total) by mouth daily.    Dispense:  90 tablet    Refill:  1  . losartan-hydrochlorothiazide (HYZAAR) 50-12.5 MG tablet    Sig: Take 1 tablet by mouth daily.    Dispense:  90 tablet    Refill:  3    Medications Discontinued During This Encounter  Medication Reason  . losartan (COZAAR) 50 MG tablet   . hydrochlorothiazide (HYDRODIURIL) 12.5 MG tablet     Follow-up: No Follow-up on file.   Sherlene ShamsULLO, Domini Vandehei L, MD

## 2016-04-13 NOTE — Progress Notes (Signed)
Pre-visit discussion using our clinic review tool. No additional management support is needed unless otherwise documented below in the visit note.  

## 2016-04-15 DIAGNOSIS — F411 Generalized anxiety disorder: Secondary | ICD-10-CM | POA: Insufficient documentation

## 2016-04-15 NOTE — Assessment & Plan Note (Signed)
[facility] and inpatient admission was advised but deferred by patient due to husband's current unemployed status

## 2016-04-15 NOTE — Assessment & Plan Note (Signed)
Finally controlled with concurrent treatment for GAD. screening for pheochromocytoma was negative,  Done due to recurrent epsiodes of very elevated readings and accompanied by malaise .  Lab Results  Component Value Date   CREATININE 0.68 01/30/2016   Lab Results  Component Value Date   NA 138 01/30/2016   K 3.7 01/30/2016   CL 105 01/30/2016   CO2 25 01/30/2016

## 2016-04-15 NOTE — Assessment & Plan Note (Signed)
adding zoloft starting at 12.5 mg daily,  Bi weekkly titration to 100 mg planned

## 2016-05-06 ENCOUNTER — Other Ambulatory Visit: Payer: Self-pay | Admitting: Family Medicine

## 2016-05-07 ENCOUNTER — Other Ambulatory Visit: Payer: Self-pay | Admitting: Family Medicine

## 2016-05-13 ENCOUNTER — Encounter: Payer: Self-pay | Admitting: Internal Medicine

## 2016-06-04 ENCOUNTER — Encounter: Payer: Self-pay | Admitting: Internal Medicine

## 2016-06-05 MED ORDER — DIAZEPAM 5 MG PO TABS: 5.0000 mg | ORAL_TABLET | Freq: Two times a day (BID) | ORAL | 1 refills | Status: DC | PRN

## 2016-06-05 MED ORDER — ALPRAZOLAM 0.5 MG PO TABS: 0.5000 mg | ORAL_TABLET | Freq: Every evening | ORAL | 3 refills | Status: DC | PRN

## 2016-07-02 ENCOUNTER — Ambulatory Visit
Admission: RE | Admit: 2016-07-02 | Discharge: 2016-07-02 | Disposition: A | Discharge status: Home / Self Care | Payer: Commercial Managed Care - HMO | Source: Ambulatory Visit | Attending: Internal Medicine | Admitting: Internal Medicine

## 2016-07-02 DIAGNOSIS — Z1231 Encounter for screening mammogram for malignant neoplasm of breast: Secondary | ICD-10-CM | POA: Diagnosis not present

## 2016-07-02 DIAGNOSIS — Z1239 Encounter for other screening for malignant neoplasm of breast: Secondary | ICD-10-CM

## 2016-07-08 ENCOUNTER — Other Ambulatory Visit: Payer: Self-pay | Admitting: Internal Medicine

## 2016-07-11 ENCOUNTER — Other Ambulatory Visit: Payer: Self-pay | Admitting: Internal Medicine

## 2016-07-18 ENCOUNTER — Other Ambulatory Visit: Payer: Self-pay | Admitting: Internal Medicine

## 2016-07-21 ENCOUNTER — Other Ambulatory Visit: Payer: Self-pay | Admitting: Internal Medicine

## 2016-08-04 ENCOUNTER — Other Ambulatory Visit: Payer: Self-pay | Admitting: Internal Medicine

## 2016-08-06 NOTE — Telephone Encounter (Signed)
Last OV 8/17 ok to fill valium?

## 2016-08-07 ENCOUNTER — Encounter: Payer: Self-pay | Admitting: Internal Medicine

## 2016-08-07 NOTE — Telephone Encounter (Signed)
Ok to refill,  Needs 6 month follow up in feb

## 2016-08-30 ENCOUNTER — Ambulatory Visit
Admission: EM | Admit: 2016-08-30 | Discharge: 2016-08-30 | Disposition: A | Discharge status: Home / Self Care | Payer: Commercial Managed Care - HMO | Attending: Family Medicine | Admitting: Family Medicine

## 2016-08-30 DIAGNOSIS — J018 Other acute sinusitis: Secondary | ICD-10-CM

## 2016-08-30 DIAGNOSIS — J111 Influenza due to unidentified influenza virus with other respiratory manifestations: Secondary | ICD-10-CM

## 2016-08-30 DIAGNOSIS — R69 Illness, unspecified: Secondary | ICD-10-CM

## 2016-08-30 DIAGNOSIS — R6889 Other general symptoms and signs: Secondary | ICD-10-CM

## 2016-08-30 LAB — RAPID STREP SCREEN (MED CTR MEBANE ONLY): STREPTOCOCCUS, GROUP A SCREEN (DIRECT): NEGATIVE

## 2016-08-30 LAB — RAPID INFLUENZA A&B ANTIGENS (ARMC ONLY)
INFLUENZA A (ARMC): NEGATIVE
INFLUENZA B (ARMC): NEGATIVE

## 2016-08-30 MED ORDER — AMOXICILLIN-POT CLAVULANATE 875-125 MG PO TABS: 1.0000 | ORAL_TABLET | Freq: Two times a day (BID) | ORAL | 0 refills | Status: DC

## 2016-08-30 MED ORDER — FEXOFENADINE-PSEUDOEPHED ER 180-240 MG PO TB24: 1.0000 | ORAL_TABLET | Freq: Every day | ORAL | 0 refills | Status: DC

## 2016-08-30 MED ORDER — MELOXICAM 15 MG PO TABS: 15.0000 mg | ORAL_TABLET | Freq: Every day | ORAL | 0 refills | Status: DC

## 2016-08-30 MED ORDER — HYDROCOD POLST-CPM POLST ER 10-8 MG/5ML PO SUER: 5.0000 mL | Freq: Two times a day (BID) | ORAL | 0 refills | Status: DC | PRN

## 2016-08-30 MED ORDER — OSELTAMIVIR PHOSPHATE 75 MG PO CAPS: 75.0000 mg | ORAL_CAPSULE | Freq: Two times a day (BID) | ORAL | 0 refills | Status: DC

## 2016-08-30 NOTE — ED Provider Notes (Signed)
[facility]    CSN: [id number] Arrival date & time: 08/30/16  0913     History   Chief Complaint Chief Complaint  Patient presents with  . Cough    HPI Allison Coffey is a 56 y.o. female.   Patient reports having sinus trouble for about 2 weeks. She states nasal congestion and coughing pressure which she's been using a pot and other things over-the-counter. She reports 2 days ago Tuesday she started feeling achy and in the last 48 hours she's had sore throat cough which is keeping her up at night 98 chills and fever. She was coughing up yellowish green stuff out of her nose before this happened and that is only gotten worse. She denies any drug allergies she does not smoke. She's had abdominal hysterectomy. She's had cholecystectomy and rotator cuff repair.   The history is provided by the patient. No language interpreter was used.  Cough  Cough characteristics:  Productive Sputum characteristics:  Yellow and green Severity:  Moderate Onset quality:  Sudden Duration:  3 days Timing:  Constant Progression:  Worsening Chronicity:  New Smoker: no   Context: upper respiratory infection   Context: not fumes and not smoke exposure   Relieved by:  Nothing Worsened by:  Activity Ineffective treatments:  None tried Associated symptoms: fever, myalgias, rhinorrhea, sinus congestion and sore throat     Past Medical History:  Diagnosis Date  . Anorexia 1988  . Hyperlipidemia   . Hypertension   . Syncope y-4    Patient Active Problem List   Diagnosis Date Noted  . GAD (generalized anxiety disorder) 04/15/2016  . 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 Hypertension 01/27/2016  . Visit for preventive health examination 12/10/2015  . S/P abdominal supracervical subtotal hysterectomy 12/07/2015  . Anorexia nervosa,restricting type, in partial remission, severe 01/23/2015  . Hypothyroidism 01/23/2015    Past Surgical History:  Procedure Laterality Date  .  ABDOMINAL HYSTERECTOMY  2012   Uterus only.  . CHOLECYSTECTOMY  1989  . ROTATOR CUFF REPAIR Left 2012    OB History    No data available       Home Medications    Prior to Admission medications   Medication Sig Start Date End Date Taking? Authorizing Provider  ALPRAZolam Prudy Feeler) 0.5 MG tablet Take 1 tablet (0.5 mg total) by mouth at bedtime as needed for anxiety. 06/05/16  Yes Sherlene Shams, MD  amLODipine (NORVASC) 10 MG tablet Take 1 tablet (10 mg total) by mouth daily. 03/02/16  Yes Sherlene Shams, MD  diazepam (VALIUM) 5 MG tablet TAKE 1 TABLET BY MOUTH EVERY 12 HOURS AS NEEDED FOR ANXIETY 08/07/16  Yes Sherlene Shams, MD  levothyroxine (SYNTHROID, LEVOTHROID) 75 MCG tablet TAKE 1 TABLET (75 MCG TOTAL) BY MOUTH DAILY BEFORE BREAKFAST. 07/18/16  Yes Sherlene Shams, MD  levothyroxine (SYNTHROID, LEVOTHROID) 75 MCG tablet TAKE 1 TABLET (75 MCG TOTAL) BY MOUTH DAILY BEFORE BREAKFAST. 07/23/16  Yes Sherlene Shams, MD  losartan (COZAAR) 50 MG tablet TAKE 1 TABLET (50 MG TOTAL) BY MOUTH DAILY. 07/09/16  Yes Sherlene Shams, MD  losartan-hydrochlorothiazide (HYZAAR) 50-12.5 MG tablet Take 1 tablet by mouth daily. 04/13/16  Yes Sherlene Shams, MD  sertraline (ZOLOFT) 25 MG tablet Take 1 tablet (25 mg total) by mouth daily. 04/13/16  Yes Sherlene Shams, MD  amoxicillin-clavulanate (AUGMENTIN) 875-125 MG tablet Take 1 tablet by mouth 2 (two) times daily. 08/30/16   Hassan Rowan, MD  chlorpheniramine-HYDROcodone (  TUSSIONEX PENNKINETIC ER) 10-8 MG/5ML SUER Take 5 mLs by mouth every 12 (twelve) hours as needed for cough. 08/30/16   Hassan Rowan, MD  fexofenadine-pseudoephedrine (ALLEGRA-D ALLERGY & CONGESTION) 180-240 MG 24 hr tablet Take 1 tablet by mouth daily. 08/30/16   Hassan Rowan, MD  meloxicam (MOBIC) 15 MG tablet Take 1 tablet (15 mg total) by mouth daily. 08/30/16   Hassan Rowan, MD  oseltamivir (TAMIFLU) 75 MG capsule Take 1 capsule (75 mg total) by mouth 2 (two) times daily. 08/30/16   Hassan Rowan, MD     Family History Family History  Problem Relation Age of Onset  . Heart disease Mother 75  . Stroke Mother 75  . Stroke Maternal Grandmother   . Heart disease Maternal Grandmother   . Heart disease Paternal Grandmother   . Heart disease Paternal Grandfather   . Breast cancer Neg Hx     Social History Social History  Substance Use Topics  . Smoking status: Former Smoker    Quit date: 01/19/1995  . Smokeless tobacco: Never Used  . Alcohol use 0.0 oz/week     Comment: occasionally     Allergies   Patient has no known allergies.   Review of Systems Review of Systems  Constitutional: Positive for fever.  HENT: Positive for rhinorrhea and sore throat.   Respiratory: Positive for cough.   Musculoskeletal: Positive for myalgias.  All other systems reviewed and are negative.    Physical Exam Triage Vital Signs ED Triage Vitals  Enc Vitals Group     BP 08/30/16 0939 119/81     Pulse Rate 08/30/16 0939 93     Resp 08/30/16 0939 18     Temp 08/30/16 0939 98.2 F (36.8 C)     Temp Source 08/30/16 0939 Oral     SpO2 08/30/16 0939 98 %     Weight 08/30/16 0941 128 lb (58.1 kg)     Height 08/30/16 0941 5\' 4"  (1.626 m)     Head Circumference --      Peak Flow --      Pain Score 08/30/16 0942 8     Pain Loc --      Pain Edu? --      Excl. in GC? --    No data found.   Updated Vital Signs BP 119/81 (BP Location: Left Arm)   Pulse 93   Temp 98.2 F (36.8 C) (Oral)   Resp 18   Ht 5\' 4"  (1.626 m)   Wt 128 lb (58.1 kg)   SpO2 98%   BMI 21.97 kg/m   Visual Acuity Right Eye Distance:   Left Eye Distance:   Bilateral Distance:    Right Eye Near:   Left Eye Near:    Bilateral Near:     Physical Exam  Constitutional: She appears well-developed and well-nourished.  HENT:  Head: Normocephalic and atraumatic.  Right Ear: Hearing, tympanic membrane, external ear and ear canal normal.  Left Ear: Hearing, tympanic membrane, external ear and ear canal normal.   Nose: Mucosal edema and rhinorrhea present. Right sinus exhibits no maxillary sinus tenderness and no frontal sinus tenderness. Left sinus exhibits no frontal sinus tenderness.  Mouth/Throat: No dental caries. Posterior oropharyngeal erythema present.  Eyes: Pupils are equal, round, and reactive to light.  Neck: Normal range of motion. Neck supple.  Cardiovascular: Normal rate, regular rhythm and normal heart sounds.   Pulmonary/Chest: Effort normal and breath sounds normal. No respiratory distress.  Patient actively coughing  Musculoskeletal:  Normal range of motion.  Lymphadenopathy:    She has cervical adenopathy.  Neurological: She is alert.  Psychiatric: She has a normal mood and affect.  Vitals reviewed.    UC Treatments / Results  Labs (all labs ordered are listed, but only abnormal results are displayed) Labs Reviewed  RAPID STREP SCREEN (NOT AT [facility])  RAPID INFLUENZA A&B ANTIGENS ([facility] ONLY)  CULTURE, GROUP A STREP [facility])    EKG  EKG Interpretation None       Radiology No results found.  Procedures Procedures (including critical care time)  Medications Ordered in UC Medications - No data to display   Initial Impression / Assessment and Plan / UC Course  I have reviewed the triage vital signs and the nursing notes.  Pertinent labs & imaging results that were available during my care of the patient were reviewed by me and considered in my medical decision making (see chart for details).   Results for orders placed or performed during the hospital encounter of 08/30/16  Rapid strep screen  Result Value Ref Range   Streptococcus, Group A Screen (Direct) NEGATIVE NEGATIVE  Rapid Influenza A&B Antigens ([facility] only)  Result Value Ref Range   Influenza A ([facility]) NEGATIVE NEGATIVE   Influenza B ([facility]) NEGATIVE NEGATIVE   Clinical Course   Patient will be treated for flu despite negative flu test history and presentation is consistent with flu. This despite her  getting a flu shot. Will treat with Tamiflu 75 mg twice a day test next 1 teaspoon twice a day Mobic for a pain and Allegra-D. Also will place her on Augmentin 875 one tablet twice a day for the sinus sinusitis history this been going on for 2 weeks. Work note given for today and tomorrow as well. Final Clinical Impressions(s) / UC Diagnoses   Final diagnoses:  Flu-like symptoms  Influenza-like illness  Acute non-recurrent sinusitis of other sinus    New Prescriptions Discharge Medication List as of 08/30/2016 10:20 AM    START taking these medications   Details  amoxicillin-clavulanate (AUGMENTIN) 875-125 MG tablet Take 1 tablet by mouth 2 (two) times daily., Starting Thu 08/30/2016, Normal    chlorpheniramine-HYDROcodone (TUSSIONEX PENNKINETIC ER) 10-8 MG/5ML SUER Take 5 mLs by mouth every 12 (twelve) hours as needed for cough., Starting Thu 08/30/2016, Normal    fexofenadine-pseudoephedrine (ALLEGRA-D ALLERGY & CONGESTION) 180-240 MG 24 hr tablet Take 1 tablet by mouth daily., Starting Thu 08/30/2016, Normal    meloxicam (MOBIC) 15 MG tablet Take 1 tablet (15 mg total) by mouth daily., Starting Thu 08/30/2016, Normal    oseltamivir (TAMIFLU) 75 MG capsule Take 1 capsule (75 mg total) by mouth 2 (two) times daily., Starting Thu 08/30/2016, Normal         Note: This dictation was prepared with Dragon dictation along with smaller phrase technology. Any transcriptional errors that result from this process are unintentional.   Hassan RowanEugene Delenn Ahn, MD 08/30/16 1032

## 2016-08-30 NOTE — ED Triage Notes (Signed)
Pt c/o of cough, sore throat, ear pain, chest congestion, headache, and aches for about 2 days.

## 2016-09-01 LAB — CULTURE, GROUP A STREP (THRC)

## 2016-09-11 ENCOUNTER — Other Ambulatory Visit: Payer: Self-pay | Admitting: Internal Medicine

## 2016-10-12 ENCOUNTER — Ambulatory Visit
Admission: EM | Admit: 2016-10-12 | Discharge: 2016-10-12 | Disposition: A | Discharge status: Home / Self Care | Payer: Commercial Managed Care - HMO | Attending: Family Medicine | Admitting: Family Medicine

## 2016-10-12 ENCOUNTER — Encounter: Payer: Self-pay | Admitting: Emergency Medicine

## 2016-10-12 DIAGNOSIS — J069 Acute upper respiratory infection, unspecified: Secondary | ICD-10-CM

## 2016-10-12 DIAGNOSIS — B9789 Other viral agents as the cause of diseases classified elsewhere: Secondary | ICD-10-CM

## 2016-10-12 LAB — RAPID STREP SCREEN (MED CTR MEBANE ONLY): Streptococcus, Group A Screen (Direct): NEGATIVE

## 2016-10-12 NOTE — ED Triage Notes (Signed)
Patient c/o cough, sore throat and congestion that started three days ago. Patient denies fever.

## 2016-10-12 NOTE — ED Provider Notes (Signed)
[facility]    CSN: [id number] Arrival date & time: 10/12/16  1109     History   Chief Complaint Chief Complaint  Patient presents with  . Sore Throat  . Cough    HPI Allison Coffey is a 56 y.o. female.   The history is provided by the patient.  Sore Throat   Cough  Associated symptoms: rhinorrhea and sore throat   Associated symptoms: no wheezing   URI  Presenting symptoms: congestion, cough, rhinorrhea and sore throat   Severity:  Moderate Onset quality:  Sudden Timing:  Constant Chronicity:  New Relieved by:  Nothing Worsened by:  Nothing Ineffective treatments:  None tried Associated symptoms: no wheezing   Risk factors: sick contacts   Risk factors: not elderly, no chronic cardiac disease, no chronic kidney disease, no chronic respiratory disease, no diabetes mellitus, no immunosuppression, no recent illness and no recent travel     Past Medical History:  Diagnosis Date  . Anorexia 1988  . Hyperlipidemia   . Hypertension   . Syncope y-4    Patient Active Problem List   Diagnosis Date Noted  . GAD (generalized anxiety disorder) 04/15/2016  . 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 Hypertension 01/27/2016  . Visit for preventive health examination 12/10/2015  . S/P abdominal supracervical subtotal hysterectomy 12/07/2015  . Anorexia nervosa,restricting type, in partial remission, severe 01/23/2015  . Hypothyroidism 01/23/2015    Past Surgical History:  Procedure Laterality Date  . ABDOMINAL HYSTERECTOMY  2012   Uterus only.  . CHOLECYSTECTOMY  1989  . ROTATOR CUFF REPAIR Left 2012    OB History    No data available       Home Medications    Prior to Admission medications   Medication Sig Start Date End Date Taking? Authorizing Provider  ALPRAZolam Prudy Feeler(XANAX) 0.5 MG tablet Take 1 tablet (0.5 mg total) by mouth at bedtime as needed for anxiety. 06/05/16   Sherlene Shamseresa L Tullo, MD  amLODipine (NORVASC) 10 MG tablet Take 1 tablet  (10 mg total) by mouth daily. 03/02/16   Sherlene Shamseresa L Tullo, MD  diazepam (VALIUM) 5 MG tablet TAKE 1 TABLET BY MOUTH EVERY 12 HOURS AS NEEDED FOR ANXIETY 08/07/16   Sherlene Shamseresa L Tullo, MD  fexofenadine-pseudoephedrine (ALLEGRA-D ALLERGY & CONGESTION) 180-240 MG 24 hr tablet Take 1 tablet by mouth daily. 08/30/16   Hassan RowanEugene Wade, MD  hydrochlorothiazide (MICROZIDE) 12.5 MG capsule TAKE 1 CAPSULE EVERY DAY 09/11/16   Sherlene Shamseresa L Tullo, MD  levothyroxine (SYNTHROID, LEVOTHROID) 75 MCG tablet TAKE 1 TABLET (75 MCG TOTAL) BY MOUTH DAILY BEFORE BREAKFAST. 07/18/16   Sherlene Shamseresa L Tullo, MD  losartan (COZAAR) 50 MG tablet TAKE 1 TABLET (50 MG TOTAL) BY MOUTH DAILY. 07/09/16   Sherlene Shamseresa L Tullo, MD  meloxicam (MOBIC) 15 MG tablet Take 1 tablet (15 mg total) by mouth daily. 08/30/16   Hassan RowanEugene Wade, MD  sertraline (ZOLOFT) 25 MG tablet Take 1 tablet (25 mg total) by mouth daily. 04/13/16   Sherlene Shamseresa L Tullo, MD    Family History Family History  Problem Relation Age of Onset  . Heart disease Mother 9778  . Stroke Mother 5578  . Stroke Maternal Grandmother   . Heart disease Maternal Grandmother   . Heart disease Paternal Grandmother   . Heart disease Paternal Grandfather   . Breast cancer Neg Hx     Social History Social History  Substance Use Topics  . Smoking status: Former Smoker    Quit date: 01/19/1995  .  Smokeless tobacco: Never Used  . Alcohol use 0.0 oz/week     Comment: occasionally     Allergies   Patient has no known allergies.   Review of Systems Review of Systems  HENT: Positive for congestion, rhinorrhea and sore throat.   Respiratory: Positive for cough. Negative for wheezing.      Physical Exam Triage Vital Signs ED Triage Vitals  Enc Vitals Group     BP 10/12/16 1154 124/80     Pulse Rate 10/12/16 1154 71     Resp 10/12/16 1154 16     Temp 10/12/16 1154 97.9 F (36.6 C)     Temp Source 10/12/16 1154 Oral     SpO2 10/12/16 1154 100 %     Weight 10/12/16 1152 128 lb (58.1 kg)     Height  10/12/16 1152 5\' 3"  (1.6 m)     Head Circumference --      Peak Flow --      Pain Score 10/12/16 1153 6     Pain Loc --      Pain Edu? --      Excl. in GC? --    No data found.   Updated Vital Signs BP 124/80 (BP Location: Left Arm)   Pulse 71   Temp 97.9 F (36.6 C) (Oral)   Resp 16   Ht 5\' 3"  (1.6 m)   Wt 128 lb (58.1 kg)   SpO2 100%   BMI 22.67 kg/m   Visual Acuity Right Eye Distance:   Left Eye Distance:   Bilateral Distance:    Right Eye Near:   Left Eye Near:    Bilateral Near:     Physical Exam  Constitutional: She appears well-developed and well-nourished. No distress.  HENT:  Head: Normocephalic and atraumatic.  Right Ear: Tympanic membrane, external ear and ear canal normal.  Left Ear: Tympanic membrane, external ear and ear canal normal.  Nose: Mucosal edema and rhinorrhea present. No nose lacerations, sinus tenderness, nasal deformity, septal deviation or nasal septal hematoma. No epistaxis.  No foreign bodies. Right sinus exhibits no maxillary sinus tenderness and no frontal sinus tenderness. Left sinus exhibits no maxillary sinus tenderness and no frontal sinus tenderness.  Mouth/Throat: Uvula is midline, oropharynx is clear and moist and mucous membranes are normal. No oropharyngeal exudate.  Eyes: Conjunctivae and EOM are normal. Pupils are equal, round, and reactive to light. Right eye exhibits no discharge. Left eye exhibits no discharge. No scleral icterus.  Neck: Normal range of motion. Neck supple. No thyromegaly present.  Cardiovascular: Normal rate, regular rhythm and normal heart sounds.   Pulmonary/Chest: Effort normal and breath sounds normal. No respiratory distress. She has no wheezes. She has no rales.  Lymphadenopathy:    She has no cervical adenopathy.  Skin: She is not diaphoretic.  Nursing note and vitals reviewed.    UC Treatments / Results  Labs (all labs ordered are listed, but only abnormal results are displayed) Labs Reviewed   RAPID STREP SCREEN (NOT AT [facility])  CULTURE, GROUP A STREP [facility])    EKG  EKG Interpretation None       Radiology No results found.  Procedures Procedures (including critical care time)  Medications Ordered in UC Medications - No data to display   Initial Impression / Assessment and Plan / UC Course  I have reviewed the triage vital signs and the nursing notes.  Pertinent labs & imaging results that were available during my care of the patient were reviewed  by me and considered in my medical decision making (see chart for details).       Final Clinical Impressions(s) / UC Diagnoses   Final diagnoses:  Viral URI with cough    New Prescriptions Discharge Medication List as of 10/12/2016 12:27 PM     1. Lab results and diagnosis reviewed with patien 2. rx as per orders above; reviewed possible side effects, interactions, risks and benefits  3. Recommend supportive treatment with rest, fluids 4. Follow-up prn if symptoms worsen or don't improve   Payton Mccallum, MD 10/12/16 1255

## 2016-10-15 LAB — CULTURE, GROUP A STREP (THRC)

## 2016-10-17 ENCOUNTER — Encounter: Payer: Self-pay | Admitting: Internal Medicine

## 2016-10-22 ENCOUNTER — Other Ambulatory Visit: Payer: Self-pay | Admitting: Internal Medicine

## 2016-11-07 ENCOUNTER — Other Ambulatory Visit: Payer: Self-pay | Admitting: Internal Medicine

## 2016-11-13 ENCOUNTER — Other Ambulatory Visit: Payer: Self-pay

## 2016-11-13 DIAGNOSIS — I10 Essential (primary) hypertension: Secondary | ICD-10-CM

## 2016-11-13 DIAGNOSIS — E034 Atrophy of thyroid (acquired): Secondary | ICD-10-CM

## 2016-11-13 DIAGNOSIS — F5001 Anorexia nervosa, restricting type: Secondary | ICD-10-CM

## 2016-11-13 DIAGNOSIS — Z0000 Encounter for general adult medical examination without abnormal findings: Secondary | ICD-10-CM

## 2016-11-13 MED ORDER — LEVOTHYROXINE SODIUM 75 MCG PO TABS: 75.0000 ug | ORAL_TABLET | Freq: Every day | ORAL | 0 refills | Status: DC

## 2016-11-13 NOTE — Telephone Encounter (Signed)
Actually, her Last tsh was checked in 2016.   Refill for 30 days only..  Needs fasting labs   Lab Results  Component Value Date   TSH 2.37 01/19/2015

## 2016-11-13 NOTE — Telephone Encounter (Signed)
Refilled 07/18/2016 Last OV: 04/13/2016 Next OV: not scheduled.  Last TSH: 01/19/2016

## 2016-11-14 NOTE — Telephone Encounter (Signed)
LMTCB. Need to schedule pt a fasting lab appt.

## 2016-11-15 NOTE — Telephone Encounter (Signed)
LMTCB

## 2016-11-16 ENCOUNTER — Encounter: Payer: Self-pay | Admitting: Internal Medicine

## 2016-11-20 ENCOUNTER — Other Ambulatory Visit: Payer: Self-pay

## 2016-11-20 MED ORDER — SERTRALINE HCL 25 MG PO TABS: 25.0000 mg | ORAL_TABLET | Freq: Every day | ORAL | 1 refills | Status: DC

## 2016-11-29 NOTE — Telephone Encounter (Signed)
LMTCB

## 2016-12-04 ENCOUNTER — Encounter: Payer: Self-pay | Admitting: Internal Medicine

## 2016-12-05 ENCOUNTER — Encounter: Payer: Self-pay | Admitting: Internal Medicine

## 2016-12-05 NOTE — Telephone Encounter (Signed)
Have attempted to call pt several times, left messages and have not gotten a returned call.

## 2016-12-13 ENCOUNTER — Other Ambulatory Visit: Payer: Self-pay | Admitting: Internal Medicine

## 2016-12-14 ENCOUNTER — Other Ambulatory Visit: Payer: Self-pay

## 2016-12-17 ENCOUNTER — Other Ambulatory Visit: Payer: Self-pay

## 2016-12-17 MED ORDER — LOSARTAN POTASSIUM 50 MG PO TABS: 50.0000 mg | ORAL_TABLET | Freq: Every day | ORAL | 0 refills | Status: DC

## 2016-12-19 ENCOUNTER — Other Ambulatory Visit (INDEPENDENT_AMBULATORY_CARE_PROVIDER_SITE_OTHER): Payer: BLUE CROSS/BLUE SHIELD

## 2016-12-19 DIAGNOSIS — I10 Essential (primary) hypertension: Secondary | ICD-10-CM

## 2016-12-19 DIAGNOSIS — E034 Atrophy of thyroid (acquired): Secondary | ICD-10-CM | POA: Diagnosis not present

## 2016-12-19 DIAGNOSIS — Z0000 Encounter for general adult medical examination without abnormal findings: Secondary | ICD-10-CM

## 2016-12-19 DIAGNOSIS — F5001 Anorexia nervosa, restricting type: Secondary | ICD-10-CM | POA: Diagnosis not present

## 2016-12-19 DIAGNOSIS — F50014 Anorexia nervosa, restricting type, in remission: Secondary | ICD-10-CM

## 2016-12-19 LAB — LIPID PANEL
CHOL/HDL RATIO: 2
Cholesterol: 210 mg/dL — ABNORMAL HIGH (ref 0–200)
HDL: 101.1 mg/dL (ref >39.00)
LDL Cholesterol: 97 mg/dL (ref 0–99)
NONHDL: 109.29
TRIGLYCERIDES: 59 mg/dL (ref 0.0–149.0)
VLDL: 11.8 mg/dL (ref 0.0–40.0)

## 2016-12-19 LAB — COMPREHENSIVE METABOLIC PANEL
ALK PHOS: 59 U/L (ref 39–117)
ALT: 19 U/L (ref 0–35)
AST: 24 U/L (ref 0–37)
Albumin: 4.3 g/dL (ref 3.5–5.2)
BILIRUBIN TOTAL: 0.7 mg/dL (ref 0.2–1.2)
BUN: 25 mg/dL — AB (ref 6–23)
CO2: 31 meq/L (ref 19–32)
CREATININE: 0.79 mg/dL (ref 0.40–1.20)
Calcium: 9.5 mg/dL (ref 8.4–10.5)
Chloride: 97 mEq/L (ref 96–112)
GFR: 80.03 mL/min (ref >60.00)
GLUCOSE: 84 mg/dL (ref 70–99)
Potassium: 3.7 mEq/L (ref 3.5–5.1)
Sodium: 136 mEq/L (ref 135–145)
TOTAL PROTEIN: 7.3 g/dL (ref 6.0–8.3)

## 2016-12-19 LAB — CBC WITH DIFFERENTIAL/PLATELET
BASOS ABS: 0 10*3/uL (ref 0.0–0.1)
BASOS PCT: 0.7 % (ref 0.0–3.0)
EOS ABS: 0.2 10*3/uL (ref 0.0–0.7)
Eosinophils Relative: 2.9 % (ref 0.0–5.0)
HCT: 41.8 % (ref 36.0–46.0)
Hemoglobin: 14.1 g/dL (ref 12.0–15.0)
LYMPHS ABS: 2.3 10*3/uL (ref 0.7–4.0)
Lymphocytes Relative: 32.8 % (ref 12.0–46.0)
MCHC: 33.8 g/dL (ref 30.0–36.0)
MCV: 97.1 fl (ref 78.0–100.0)
Monocytes Absolute: 0.5 10*3/uL (ref 0.1–1.0)
Monocytes Relative: 7.5 % (ref 3.0–12.0)
NEUTROS ABS: 3.9 10*3/uL (ref 1.4–7.7)
NEUTROS PCT: 56.1 % (ref 43.0–77.0)
PLATELETS: 302 10*3/uL (ref 150.0–400.0)
RBC: 4.31 Mil/uL (ref 3.87–5.11)
RDW: 13.8 % (ref 11.5–15.5)
WBC: 7 10*3/uL (ref 4.0–10.5)

## 2016-12-19 LAB — TSH: TSH: 1.07 u[IU]/mL (ref 0.35–4.50)

## 2016-12-23 ENCOUNTER — Encounter: Payer: Self-pay | Admitting: Internal Medicine

## 2016-12-23 ENCOUNTER — Other Ambulatory Visit: Payer: Self-pay | Admitting: Internal Medicine

## 2016-12-23 MED ORDER — LEVOTHYROXINE SODIUM 75 MCG PO TABS: 75.0000 ug | ORAL_TABLET | Freq: Every day | ORAL | 5 refills | Status: DC

## 2017-01-10 ENCOUNTER — Other Ambulatory Visit: Payer: Self-pay | Admitting: Internal Medicine

## 2017-01-11 ENCOUNTER — Other Ambulatory Visit: Payer: Self-pay | Admitting: Internal Medicine

## 2017-01-14 ENCOUNTER — Other Ambulatory Visit: Payer: Self-pay | Admitting: Internal Medicine

## 2017-03-10 ENCOUNTER — Other Ambulatory Visit: Payer: Self-pay | Admitting: Internal Medicine

## 2017-03-25 ENCOUNTER — Other Ambulatory Visit: Payer: Self-pay | Admitting: Internal Medicine

## 2017-04-01 ENCOUNTER — Other Ambulatory Visit: Payer: Self-pay | Admitting: Internal Medicine

## 2017-04-13 ENCOUNTER — Other Ambulatory Visit: Payer: Self-pay | Admitting: Internal Medicine

## 2017-04-17 ENCOUNTER — Other Ambulatory Visit: Payer: Self-pay | Admitting: Internal Medicine

## 2017-04-17 NOTE — Telephone Encounter (Signed)
Left voicemail for patient to schedule follow up and labs

## 2017-04-18 ENCOUNTER — Telehealth: Payer: Self-pay

## 2017-04-18 NOTE — Telephone Encounter (Signed)
Patient has been scheduled for follow up 06/05/2017

## 2017-05-15 ENCOUNTER — Other Ambulatory Visit: Payer: Self-pay | Admitting: Internal Medicine

## 2017-05-20 ENCOUNTER — Encounter: Payer: Self-pay | Admitting: Internal Medicine

## 2017-06-02 ENCOUNTER — Other Ambulatory Visit: Payer: Self-pay | Admitting: Internal Medicine

## 2017-06-05 ENCOUNTER — Encounter: Payer: Self-pay | Admitting: Internal Medicine

## 2017-06-05 ENCOUNTER — Ambulatory Visit (INDEPENDENT_AMBULATORY_CARE_PROVIDER_SITE_OTHER): Payer: Self-pay | Admitting: Internal Medicine

## 2017-06-05 VITALS — BP 116/86 | HR 71 | Temp 98.3°F | Resp 14 | Ht 63.0 in | Wt 150.6 lb

## 2017-06-05 DIAGNOSIS — F5001 Anorexia nervosa, restricting type: Secondary | ICD-10-CM

## 2017-06-05 DIAGNOSIS — Z23 Encounter for immunization: Secondary | ICD-10-CM

## 2017-06-05 DIAGNOSIS — F411 Generalized anxiety disorder: Secondary | ICD-10-CM

## 2017-06-05 DIAGNOSIS — I10 Essential (primary) hypertension: Secondary | ICD-10-CM

## 2017-06-05 DIAGNOSIS — Z1239 Encounter for other screening for malignant neoplasm of breast: Secondary | ICD-10-CM

## 2017-06-05 DIAGNOSIS — E034 Atrophy of thyroid (acquired): Secondary | ICD-10-CM

## 2017-06-05 DIAGNOSIS — Z1231 Encounter for screening mammogram for malignant neoplasm of breast: Secondary | ICD-10-CM

## 2017-06-05 LAB — TSH: TSH: 15.3 u[IU]/mL — AB (ref 0.35–4.50)

## 2017-06-05 LAB — COMPREHENSIVE METABOLIC PANEL
ALK PHOS: 70 U/L (ref 39–117)
ALT: 16 U/L (ref 0–35)
AST: 25 U/L (ref 0–37)
Albumin: 4.6 g/dL (ref 3.5–5.2)
BILIRUBIN TOTAL: 0.7 mg/dL (ref 0.2–1.2)
BUN: 20 mg/dL (ref 6–23)
CO2: 30 mEq/L (ref 19–32)
Calcium: 9.8 mg/dL (ref 8.4–10.5)
Chloride: 99 mEq/L (ref 96–112)
Creatinine, Ser: 0.76 mg/dL (ref 0.40–1.20)
GFR: 83.54 mL/min (ref >60.00)
GLUCOSE: 78 mg/dL (ref 70–99)
Potassium: 3.6 mEq/L (ref 3.5–5.1)
SODIUM: 138 meq/L (ref 135–145)
TOTAL PROTEIN: 7.8 g/dL (ref 6.0–8.3)

## 2017-06-05 MED ORDER — ALPRAZOLAM 0.5 MG PO TABS: 0.5000 mg | ORAL_TABLET | Freq: Every evening | ORAL | 3 refills | Status: DC | PRN

## 2017-06-05 MED ORDER — BUPROPION HCL ER (XL) 150 MG PO TB24: 150.0000 mg | ORAL_TABLET | Freq: Every day | ORAL | 3 refills | Status: DC

## 2017-06-05 NOTE — Patient Instructions (Addendum)
PLEASE GET  YOUR BP CHECKED 3 TIMES OVER THE NEXT 2 WEEKS, AND SEND ME THE READINGS   THE NEW NORMAL IS 120/70  BUT I'M HAPPY IF ALL READINGS ARE < 130/80   I AM CHANGING YOUR ANTIDEPRESSANT TO WELLBUTRIN   CONTINUE ZOLOFT FOR ONE WEEK, WHILE STARTING WELLBUTRIN   AFTER ONE WEEK OF WELLBUTRIN STOP THE ZOLOFT  ALPRAZOLAM HAS BEEN REFILLED FOR PRN USE   REFERRAL IN PROCESS  TO EATING DISORDER CLINIC ([facility] OR [facility])

## 2017-06-05 NOTE — Progress Notes (Signed)
Subjective:  Patient ID: Allison Coffey, female    DOB: 1961-08-17  Age: 56 y.o. MRN: [id number]  CC: The primary encounter diagnosis was Breast cancer screening. Diagnoses of Need for immunization against influenza, Hypothyroidism due to acquired atrophy of thyroid, Essential hypertension, Anorexia nervosa,restricting type, in partial remission, severe, and GAD (generalized anxiety disorder) were also pertinent to this visit.  HPI Allison Coffey presents for FOLLOW UP ON CHronic ISSUES INCLUDING HYPERTENSION,  OVERWEIGHT WITH untreated EATING DISORDER ,   GAD, and hypothyroidism  Has gained 22 lbs since last visit   Despite exercising vigorously 5 days per week and following the  KETO DIET.  She is dismayed, frustrated and unhappy.  Does not feel that the sertraline has helped her anxiety and attributes the weigh gain to the [facility] She is ready to accept help for her eating disorder.    She does not check her blood pressure   COLONOSCOPY NORMAL AT [facility] 2012     Outpatient Medications Prior to Visit  Medication Sig Dispense Refill  . amLODipine (NORVASC) 5 MG tablet TAKE 1 TABLET (5 MG TOTAL) BY MOUTH DAILY. 90 tablet 1  . hydrochlorothiazide (MICROZIDE) 12.5 MG capsule TAKE 1 CAPSULE EVERY DAY 30 capsule 2  . losartan (COZAAR) 50 MG tablet TAKE 1 TABLET BY MOUTH EVERY DAY 90 tablet 0  . levothyroxine (SYNTHROID, LEVOTHROID) 75 MCG tablet Take 1 tablet (75 mcg total) by mouth daily before breakfast. 30 tablet 5  . sertraline (ZOLOFT) 25 MG tablet TAKE 1 TABLET (25 MG TOTAL) BY MOUTH DAILY. 90 tablet 1  . ALPRAZolam (XANAX) 0.5 MG tablet Take 1 tablet (0.5 mg total) by mouth at bedtime as needed for anxiety. (Patient not taking: Reported on 06/05/2017) 30 tablet 3  . amLODipine (NORVASC) 10 MG tablet Take 1 tablet (10 mg total) by mouth daily. (Patient not taking: Reported on 06/05/2017) 30 tablet 5  . diazepam (VALIUM) 5 MG tablet TAKE 1 TABLET BY MOUTH EVERY 12 HOURS AS NEEDED FOR  ANXIETY (Patient not taking: Reported on 06/05/2017) 30 tablet 1  . fexofenadine-pseudoephedrine (ALLEGRA-D ALLERGY & CONGESTION) 180-240 MG 24 hr tablet Take 1 tablet by mouth daily. (Patient not taking: Reported on 06/05/2017) 30 tablet 0  . hydrochlorothiazide (MICROZIDE) 12.5 MG capsule TAKE 1 CAPSULE EVERY DAY (Patient not taking: Reported on 06/05/2017) 30 capsule 2  . hydrochlorothiazide (MICROZIDE) 12.5 MG capsule TAKE 1 CAPSULE EVERY DAY (Patient not taking: Reported on 06/05/2017) 30 capsule 5  . hydrochlorothiazide (MICROZIDE) 12.5 MG capsule TAKE 1 CAPSULE EVERY DAY (Patient not taking: Reported on 06/05/2017) 30 capsule 5  . hydrochlorothiazide (MICROZIDE) 12.5 MG capsule TAKE 1 CAPSULE EVERY DAY (Patient not taking: Reported on 06/05/2017) 30 capsule 5  . levothyroxine (SYNTHROID, LEVOTHROID) 75 MCG tablet TAKE 1 TABLET (75 MCG TOTAL) BY MOUTH DAILY BEFORE BREAKFAST. (Patient not taking: Reported on 06/05/2017) 30 tablet 1  . meloxicam (MOBIC) 15 MG tablet Take 1 tablet (15 mg total) by mouth daily. (Patient not taking: Reported on 06/05/2017) 30 tablet 0   No facility-administered medications prior to visit.     Review of Systems;  Patient denies headache, fevers, malaise, unintentional weight loss, skin rash, eye pain, sinus congestion and sinus pain, sore throat, dysphagia,  hemoptysis , cough, dyspnea, wheezing, chest pain, palpitations, orthopnea, edema, abdominal pain, nausea, melena, diarrhea, constipation, flank pain, dysuria, hematuria, urinary  Frequency, nocturia, numbness, tingling, seizures,  Focal weakness, Loss of consciousness,  Tremor, insomnia, depression, anxiety, and suicidal ideation.  Objective:  BP 116/86 (BP Location: Left Arm, Patient Position: Sitting, Cuff Size: Normal)   Pulse 71   Temp 98.3 F (36.8 C) (Oral)   Resp 14   Ht  (1.6 m)   Wt 150 lb 9.6 oz (68.3 kg)   SpO2 96%   BMI 26.68 kg/m   BP Readings from Last 3 Encounters:    06/05/17 116/86  10/12/16 124/80  08/30/16 119/81    Wt Readings from Last 3 Encounters:  06/05/17 150 lb 9.6 oz (68.3 kg)  10/12/16 128 lb (58.1 kg)  08/30/16 128 lb (58.1 kg)    General appearance: alert, cooperative and appears stated age Ears: normal TM's and external ear canals both ears Throat: lips, mucosa, and tongue normal; teeth and gums normal Neck: no adenopathy, no carotid bruit, supple, symmetrical, trachea midline and thyroid not enlarged, symmetric, no tenderness/mass/nodules Back: symmetric, no curvature. ROM normal. No CVA tenderness. Lungs: clear to auscultation bilaterally Heart: regular rate and rhythm, S1, S2 normal, no murmur, click, rub or gallop Abdomen: soft, non-tender; bowel sounds normal; no masses,  no organomegaly Pulses: 2+ and symmetric Skin: Skin color, texture, turgor normal. No rashes or lesions Lymph nodes: Cervical, supraclavicular, and axillary nodes normal.  No results found for: HGBA1C  Lab Results  Component Value Date   CREATININE 0.76 06/05/2017   CREATININE 0.79 12/19/2016   CREATININE 0.68 01/30/2016    Lab Results  Component Value Date   WBC 7.0 12/19/2016   HGB 14.1 12/19/2016   HCT 41.8 12/19/2016   PLT 302.0 12/19/2016   GLUCOSE 78 06/05/2017   CHOL 210 (H) 12/19/2016   TRIG 59.0 12/19/2016   HDL 101.10 12/19/2016   LDLCALC 97 12/19/2016   ALT 16 06/05/2017   AST 25 06/05/2017   NA 138 06/05/2017   K 3.6 06/05/2017   CL 99 06/05/2017   CREATININE 0.76 06/05/2017   BUN 20 06/05/2017   CO2 30 06/05/2017   TSH 15.30 (H) 06/05/2017    No results found.  Assessment & Plan:   Problem List Items Addressed This Visit    Anorexia nervosa,restricting type, in partial remission, severe    Repeat referral to [facility] is underway      Relevant Orders   Ambulatory referral to Psychology   GAD (generalized anxiety disorder)    Continue prn alprazolam . Changing zoloft to wellbutrin given weight  gain.       Relevant Medications   ALPRAZolam (XANAX) 0.5 MG tablet   buPROPion (WELLBUTRIN XL) 150 MG 24 hr tablet   Hypertension   Relevant Orders   Comprehensive metabolic panel (Completed)   Hypothyroidism    Thyroid was underactive on current synthroid dose. Increasing dose to 88 mcg daily  and repeat TSH level after  6 weeks      Relevant Medications   levothyroxine (SYNTHROID, LEVOTHROID) 88 MCG tablet   Other Relevant Orders   TSH (Completed)    Other Visit Diagnoses    Breast cancer screening    -  Primary   Relevant Orders   MM SCREENING BREAST TOMO BILATERAL   Need for immunization against influenza       Relevant Orders   Flu Vaccine QUAD 36+ mos IM (Completed)      I have discontinued Ms. Hortman's diazepam, fexofenadine-pseudoephedrine, meloxicam, levothyroxine, and sertraline. I have also changed her levothyroxine. Additionally, I am having her start on buPROPion. Lastly, I am having her maintain her hydrochlorothiazide, losartan, amLODipine, and ALPRAZolam.  Meds ordered this encounter  Medications  . ALPRAZolam (XANAX) 0.5 MG tablet    Sig: Take 1 tablet (0.5 mg total) by mouth at bedtime as needed for anxiety.    Dispense:  30 tablet    Refill:  3  . buPROPion (WELLBUTRIN XL) 150 MG 24 hr tablet    Sig: Take 1 tablet (150 mg total) by mouth daily.    Dispense:  30 tablet    Refill:  3  . levothyroxine (SYNTHROID, LEVOTHROID) 88 MCG tablet    Sig: Take 1 tablet (88 mcg total) by mouth daily before breakfast.    Dispense:  90 tablet    Refill:  1    Medications Discontinued During This Encounter  Medication Reason  . ALPRAZolam (XANAX) 0.5 MG tablet Patient has not taken in last 30 days  . amLODipine (NORVASC) 10 MG tablet Patient has not taken in last 30 days  . diazepam (VALIUM) 5 MG tablet Patient has not taken in last 30 days  . fexofenadine-pseudoephedrine (ALLEGRA-D ALLERGY & CONGESTION) 180-240 MG 24 hr tablet Patient has not taken in last 30  days  . hydrochlorothiazide (MICROZIDE) 12.5 MG capsule Duplicate  . hydrochlorothiazide (MICROZIDE) 12.5 MG capsule Duplicate  . hydrochlorothiazide (MICROZIDE) 12.5 MG capsule Duplicate  . hydrochlorothiazide (MICROZIDE) 12.5 MG capsule Duplicate  . levothyroxine (SYNTHROID, LEVOTHROID) 75 MCG tablet Duplicate  . meloxicam (MOBIC) 15 MG tablet Patient has not taken in last 30 days  . sertraline (ZOLOFT) 25 MG tablet   . levothyroxine (SYNTHROID, LEVOTHROID) 75 MCG tablet Reorder    Follow-up: No Follow-up on file.   Sherlene Shams, MD

## 2017-06-06 MED ORDER — LEVOTHYROXINE SODIUM 88 MCG PO TABS: 88.0000 ug | ORAL_TABLET | Freq: Every day | ORAL | 1 refills | Status: DC

## 2017-06-06 NOTE — Assessment & Plan Note (Addendum)
Continue prn alprazolam . Changing zoloft to wellbutrin given weight gain.

## 2017-06-06 NOTE — Assessment & Plan Note (Signed)
Thyroid was underactive on current synthroid dose. Increasing dose to 88 mcg daily  and repeat TSH level after  6 weeks

## 2017-06-06 NOTE — Assessment & Plan Note (Signed)
Repeat referral to [facility] is underway

## 2017-06-07 ENCOUNTER — Encounter: Payer: Self-pay | Admitting: Internal Medicine

## 2017-06-18 ENCOUNTER — Encounter: Payer: Self-pay | Admitting: Internal Medicine

## 2017-06-28 ENCOUNTER — Encounter: Payer: Self-pay | Admitting: Internal Medicine

## 2017-06-28 ENCOUNTER — Other Ambulatory Visit: Payer: Self-pay

## 2017-06-28 MED ORDER — BUPROPION HCL ER (XL) 150 MG PO TB24: 150.0000 mg | ORAL_TABLET | Freq: Every day | ORAL | 0 refills | Status: DC

## 2017-07-16 ENCOUNTER — Encounter: Payer: Self-pay | Admitting: Internal Medicine

## 2017-07-16 ENCOUNTER — Ambulatory Visit
Admission: RE | Admit: 2017-07-16 | Discharge: 2017-07-16 | Disposition: A | Discharge status: Home / Self Care | Payer: BLUE CROSS/BLUE SHIELD | Source: Ambulatory Visit | Attending: Internal Medicine | Admitting: Internal Medicine

## 2017-07-16 DIAGNOSIS — Z1239 Encounter for other screening for malignant neoplasm of breast: Secondary | ICD-10-CM

## 2017-07-16 DIAGNOSIS — Z1231 Encounter for screening mammogram for malignant neoplasm of breast: Secondary | ICD-10-CM | POA: Diagnosis not present

## 2017-07-17 ENCOUNTER — Other Ambulatory Visit: Payer: Self-pay | Admitting: Internal Medicine

## 2017-08-06 ENCOUNTER — Other Ambulatory Visit: Payer: Self-pay | Admitting: Internal Medicine

## 2017-08-07 ENCOUNTER — Ambulatory Visit
Admission: EM | Admit: 2017-08-07 | Discharge: 2017-08-07 | Disposition: A | Discharge status: Home / Self Care | Payer: BLUE CROSS/BLUE SHIELD | Attending: Family Medicine | Admitting: Family Medicine

## 2017-08-07 ENCOUNTER — Other Ambulatory Visit: Payer: Self-pay

## 2017-08-07 DIAGNOSIS — R059 Cough, unspecified: Secondary | ICD-10-CM

## 2017-08-07 DIAGNOSIS — R05 Cough: Secondary | ICD-10-CM | POA: Diagnosis not present

## 2017-08-07 DIAGNOSIS — J0110 Acute frontal sinusitis, unspecified: Secondary | ICD-10-CM | POA: Diagnosis not present

## 2017-08-07 DIAGNOSIS — J0100 Acute maxillary sinusitis, unspecified: Secondary | ICD-10-CM

## 2017-08-07 MED ORDER — BENZONATATE 100 MG PO CAPS: 100.0000 mg | ORAL_CAPSULE | Freq: Three times a day (TID) | ORAL | 0 refills | Status: DC | PRN

## 2017-08-07 MED ORDER — AMOXICILLIN-POT CLAVULANATE 875-125 MG PO TABS: 1.0000 | ORAL_TABLET | Freq: Two times a day (BID) | ORAL | 0 refills | Status: DC

## 2017-08-07 MED ORDER — HYDROCOD POLST-CPM POLST ER 10-8 MG/5ML PO SUER: 5.0000 mL | Freq: Every evening | ORAL | 0 refills | Status: DC | PRN

## 2017-08-07 NOTE — ED Provider Notes (Signed)
[facility] ____________________________________________  Time seen: Approximately 9:23 AM  I have reviewed the triage vital signs and the nursing notes.   HISTORY  Chief Complaint Cough and Nasal Congestion   HPI Allison Coffey is a 56 y.o. female presented for evaluation of 1.5 weeks of cough, nasal congestion, sinus drainage and sinus pressure.  Reports unresolved with over-the-counter DayQuil, NyQuil and other cough congestion medications. States some low grade fevers around 100 a few days ago. Reports overall continues to eat and drink well.  States some accompanying scratchy sore throat.  States moderate sinus pressure currently. States the cough is overall improved during the daytime, now worse at night with associated postnasal drainage.  States thick greenish nasal drainage.  Denies other aggravating or alleviating factors.  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 Denies chest pain, shortness of breath, abdominal pain, dysuria, extremity pain, extremity swelling or rash. Denies recent sickness. Denies recent antibiotic use.   Sherlene Shams, MD": PCP No LMP recorded. Patient has had a hysterectomy.   Past Medical History:  Diagnosis Date  . Anorexia 1988  . Hyperlipidemia   . Hypertension   . Syncope y-4    Patient Active Problem List   Diagnosis Date Noted  . GAD (generalized anxiety disorder) 04/15/2016  . 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 Hypertension 01/27/2016  . Visit for preventive health examination 12/10/2015  . S/P abdominal supracervical subtotal hysterectomy 12/07/2015  . Anorexia nervosa,restricting type, in partial remission, severe 01/23/2015  . Hypothyroidism 01/23/2015    Past Surgical History:  Procedure Laterality Date  . ABDOMINAL HYSTERECTOMY  2012   Uterus only.  . CHOLECYSTECTOMY  1989  . ROTATOR CUFF REPAIR Left 2012     No current facility-administered  medications for this encounter.   Current Outpatient Medications:  .  ALPRAZolam (XANAX) 0.5 MG tablet, Take 1 tablet (0.5 mg total) by mouth at bedtime as needed for anxiety., Disp: 30 tablet, Rfl: 3 .  amLODipine (NORVASC) 5 MG tablet, TAKE 1 TABLET (5 MG TOTAL) BY MOUTH DAILY., Disp: 90 tablet, Rfl: 1 .  buPROPion (WELLBUTRIN XL) 150 MG 24 hr tablet, Take 1 tablet (150 mg total) by mouth daily., Disp: 90 tablet, Rfl: 0 .  hydrochlorothiazide (MICROZIDE) 12.5 MG capsule, TAKE 1 CAPSULE EVERY DAY, Disp: 30 capsule, Rfl: 2 .  levothyroxine (SYNTHROID, LEVOTHROID) 88 MCG tablet, Take 1 tablet (88 mcg total) by mouth daily before breakfast., Disp: 90 tablet, Rfl: 1 .  losartan (COZAAR) 50 MG tablet, TAKE 1 TABLET BY MOUTH EVERY DAY, Disp: 90 tablet, Rfl: 0 .  amoxicillin-clavulanate (AUGMENTIN) 875-125 MG tablet, Take 1 tablet by mouth every 12 (twelve) hours., Disp: 20 tablet, Rfl: 0 .  benzonatate (TESSALON PERLES) 100 MG capsule, Take 1 capsule (100 mg total) by mouth 3 (three) times daily as needed for cough., Disp: 15 capsule, Rfl: 0 .  chlorpheniramine-HYDROcodone (TUSSIONEX PENNKINETIC ER) 10-8 MG/5ML SUER, Take 5 mLs by mouth at bedtime as needed for cough. do not drive or operate machinery while taking as can cause drowsiness., Disp: 75 mL, Rfl: 0  Allergies Patient has no known allergies.  Family History  Problem Relation Age of Onset  . Heart disease Mother 76  . Stroke Mother 59  . Stroke Maternal Grandmother   . Heart disease Maternal Grandmother   . Heart disease Paternal Grandmother   . Heart disease Paternal Grandfather   . Breast cancer Neg Hx  Social History Social History   Tobacco Use  . Smoking status: Former Smoker    Last attempt to quit: 01/19/1995    Years since quitting: 22.5  . Smokeless tobacco: Never Used  Substance Use Topics  . Alcohol use: Yes    Alcohol/week: 0.0 oz    Comment: occasionally  . Drug use: No    Review of  Systems Constitutional: No fever/chills ENT: As above.  Cardiovascular: Denies chest pain. Respiratory: Denies shortness of breath. Gastrointestinal: No abdominal pain.  No nausea, no vomiting.  No diarrhea.  No constipation. Genitourinary: Negative for dysuria. Musculoskeletal: Negative for back pain. Skin: Negative for rash.   ____________________________________________   PHYSICAL EXAM:  VITAL SIGNS: ED Triage Vitals  Enc Vitals Group     BP 08/07/17 0901 (!) 144/86     Pulse Rate 08/07/17 0901 64     Resp 08/07/17 0901 16     Temp 08/07/17 0901 97.9 F (36.6 C)     Temp Source 08/07/17 0901 Oral     SpO2 08/07/17 0901 100 %     Weight 08/07/17 0902 152 lb 1.9 oz (69 kg)     Height 08/07/17 0902 5\' 4"  (1.626 m)     Head Circumference --      Peak Flow --      Pain Score 08/07/17 0902 7     Pain Loc --      Pain Edu? --      Excl. in GC? --     Constitutional: Alert and oriented. Well appearing and in no acute distress. Eyes: Conjunctivae are normal. PERRL. EOMI. Head: Atraumatic.Mild to moderate tenderness to palpation bilateral frontal and maxillary sinuses. No swelling. No erythema.   Ears: no erythema, normal TMs bilaterally.   Nose: nasal congestion with bilateral nasal turbinate erythema and edema.   Mouth/Throat: Mucous membranes are moist.  Oropharynx non-erythematous.No tonsillar swelling or exudate.  Neck: No stridor.  No cervical spine tenderness to palpation. Hematological/Lymphatic/Immunilogical: No cervical lymphadenopathy. Cardiovascular: Normal rate, regular rhythm. Grossly normal heart sounds.  Good peripheral circulation. Respiratory: Normal respiratory effort.  No retractions.  No wheezes, rales or rhonchi. Good air movement.  Musculoskeletal:Steady gait. No cervical, thoracic or lumbar tenderness to palpation.  Neurologic:  Normal speech and language. No gross focal neurologic deficits are appreciated. No gait instability. Skin:  Skin is warm, dry  and intact. No rash noted. Psychiatric: Mood and affect are normal. Speech and behavior are normal.  ___________________________________________   LABS (all labs ordered are listed, but only abnormal results are displayed)  Labs Reviewed - No data to display ____________________________________________   PROCEDURES Procedures   INITIAL IMPRESSION / ASSESSMENT AND PLAN / ED COURSE  Pertinent labs & imaging results that were available during my care of the patient were reviewed by me and considered in my medical decision making (see chart for details).  Well-appearing patient.  No acute distress.  Lungs clear throughout.  Suspect frontal maxillary sinusitis with postnasal drainage cough.  Will treat patient with oral Augmentin, appearing Tessalon Perles and as needed Tussionex.  Encouraged rest, fluids, supportive care. Discussed indication, risks and benefits of medications with patient.  Discussed follow up with Primary care physician this week. Discussed follow up and return parameters including no resolution or any worsening concerns. Patient verbalized understanding and agreed to plan.   ____________________________________________   FINAL CLINICAL IMPRESSION(S) / ED DIAGNOSES  Final diagnoses:  Acute maxillary sinusitis, recurrence not specified  Acute frontal sinusitis, recurrence not specified  Cough     ED Discharge Orders        Ordered    chlorpheniramine-HYDROcodone (TUSSIONEX PENNKINETIC ER) 10-8 MG/5ML SUER  At bedtime PRN     08/07/17 0920    benzonatate (TESSALON PERLES) 100 MG capsule  3 times daily PRN     08/07/17 0920    amoxicillin-clavulanate (AUGMENTIN) 875-125 MG tablet  Every 12 hours     08/07/17 0920       Note: This dictation was prepared with Dragon dictation along with smaller phrase technology. Any transcriptional errors that result from this process are unintentional.         Renford DillsMiller, Darrie Macmillan, NP 08/07/17 [phone]

## 2017-08-07 NOTE — Discharge Instructions (Signed)
Take medication as prescribed. Rest. Drink plenty of fluids.  ° °Follow up with your primary care physician this week as needed. Return to Urgent care for new or worsening concerns.  ° °

## 2017-08-07 NOTE — ED Triage Notes (Signed)
1 week hx nasal congestion, cough , nausea, headache, ear pressure

## 2017-09-04 ENCOUNTER — Ambulatory Visit: Payer: BLUE CROSS/BLUE SHIELD | Admitting: Internal Medicine

## 2017-09-04 ENCOUNTER — Encounter: Payer: Self-pay | Admitting: Internal Medicine

## 2017-09-04 VITALS — BP 118/76 | HR 80 | Temp 98.0°F | Resp 15 | Ht 64.0 in | Wt 154.0 lb

## 2017-09-04 DIAGNOSIS — I10 Essential (primary) hypertension: Secondary | ICD-10-CM | POA: Diagnosis not present

## 2017-09-04 DIAGNOSIS — E162 Hypoglycemia, unspecified: Secondary | ICD-10-CM | POA: Diagnosis not present

## 2017-09-04 DIAGNOSIS — F5001 Anorexia nervosa, restricting type: Secondary | ICD-10-CM

## 2017-09-04 DIAGNOSIS — E034 Atrophy of thyroid (acquired): Secondary | ICD-10-CM | POA: Diagnosis not present

## 2017-09-04 LAB — COMPREHENSIVE METABOLIC PANEL
ALK PHOS: 63 U/L (ref 39–117)
ALT: 14 U/L (ref 0–35)
AST: 21 U/L (ref 0–37)
Albumin: 4.3 g/dL (ref 3.5–5.2)
BILIRUBIN TOTAL: 0.7 mg/dL (ref 0.2–1.2)
BUN: 17 mg/dL (ref 6–23)
CO2: 29 mEq/L (ref 19–32)
CREATININE: 0.69 mg/dL (ref 0.40–1.20)
Calcium: 9.4 mg/dL (ref 8.4–10.5)
Chloride: 102 mEq/L (ref 96–112)
GFR: 93.32 mL/min (ref >60.00)
GLUCOSE: 93 mg/dL (ref 70–99)
Potassium: 3.4 mEq/L — ABNORMAL LOW (ref 3.5–5.1)
SODIUM: 139 meq/L (ref 135–145)
TOTAL PROTEIN: 7.1 g/dL (ref 6.0–8.3)

## 2017-09-04 LAB — HEMOGLOBIN A1C: HEMOGLOBIN A1C: 5.2 % (ref 4.6–6.5)

## 2017-09-04 LAB — TSH: TSH: 1.65 u[IU]/mL (ref 0.35–4.50)

## 2017-09-04 NOTE — Progress Notes (Signed)
Subjective:  Patient ID: Allison Coffey, female    DOB: 03/03/1961  Age: 57 y.o. MRN: [id number]  CC: The primary encounter diagnosis was Hypothyroidism due to acquired atrophy of thyroid. Diagnoses of Hypoglycemia, Anorexia nervosa,restricting type, in partial remission, severe, and Essential hypertension were also pertinent to this visit.  HPI Allison Coffey presents for follow up on GAD with eating disorder,  Hypertension and hypothyroidism.   Dose of  Levothyroxine increased to  88 mcg for  TSH of 15 3 months ago .  Does not feel any change in energy ,  Still bothered by her weight gain.   treated in ED for acute sinusitis on Dec 12  Symptoms have resolved.   Discussed her eating disorder. . Still restricting herself to one meal at night:  Caloric intake is < 600 cal dAILY,  EXERCISING 5 DAYS per week with a trainer.    Has episodes of presyncope from prolonged fasting.   No prior screen for diabetes or hypoglycemia.   Can not find an eating disorder clinic that is covered by her insurance (including  [facility]).  She does admit that talking about her symptoms helps relieve her anxiety is willing to see a local therapist  For alternatives.   Hypertension: patient checks blood pressure twice weekly at home.  Readings have been for the most part > 140/80 at rest . Patient is following a reduce salt diet most days and is taking medications as prescribed   Outpatient Medications Prior to Visit  Medication Sig Dispense Refill  . ALPRAZolam (XANAX) 0.5 MG tablet Take 1 tablet (0.5 mg total) by mouth at bedtime as needed for anxiety. 30 tablet 3  . amLODipine (NORVASC) 5 MG tablet TAKE 1 TABLET (5 MG TOTAL) BY MOUTH DAILY. 90 tablet 1  . buPROPion (WELLBUTRIN XL) 150 MG 24 hr tablet Take 1 tablet (150 mg total) by mouth daily. 90 tablet 0  . hydrochlorothiazide (MICROZIDE) 12.5 MG capsule TAKE 1 CAPSULE EVERY DAY 30 capsule 2  . levothyroxine (SYNTHROID, LEVOTHROID) 88 MCG tablet Take 1 tablet  (88 mcg total) by mouth daily before breakfast. 90 tablet 1  . losartan (COZAAR) 50 MG tablet TAKE 1 TABLET BY MOUTH EVERY DAY 90 tablet 0  . amoxicillin-clavulanate (AUGMENTIN) 875-125 MG tablet Take 1 tablet by mouth every 12 (twelve) hours. (Patient not taking: Reported on 09/04/2017) 20 tablet 0  . benzonatate (TESSALON PERLES) 100 MG capsule Take 1 capsule (100 mg total) by mouth 3 (three) times daily as needed for cough. (Patient not taking: Reported on 09/04/2017) 15 capsule 0  . chlorpheniramine-HYDROcodone (TUSSIONEX PENNKINETIC ER) 10-8 MG/5ML SUER Take 5 mLs by mouth at bedtime as needed for cough. do not drive or operate machinery while taking as can cause drowsiness. (Patient not taking: Reported on 09/04/2017) 75 mL 0  . fluticasone (FLONASE) 50 MCG/ACT nasal spray 2 SPRAYS INTO EACH NOSTRIL DAILY. (Patient not taking: Reported on 09/04/2017) 16 g 0   No facility-administered medications prior to visit.     Review of Systems;  Patient denies headache, fevers, malaise, unintentional weight loss, skin rash, eye pain, sinus congestion and sinus pain, sore throat, dysphagia,  hemoptysis , cough, dyspnea, wheezing, chest pain, palpitations, orthopnea, edema, abdominal pain, nausea, melena, diarrhea, constipation, flank pain, dysuria, hematuria, urinary  Frequency, nocturia, numbness, tingling, seizures,  Focal weakness, Loss of consciousness,  Tremor, insomnia, depression, anxiety, and suicidal ideation.      Objective:  BP 118/76 (BP Location: Left Arm, Patient Position:  Sitting, Cuff Size: Normal)   Pulse 80   Temp 98 F (36.7 C) (Oral)   Resp 15   Ht 5\' 4"  (1.626 m)   Wt 154 lb (69.9 kg)   SpO2 95%   BMI 26.43 kg/m   BP Readings from Last 3 Encounters:  09/04/17 118/76  08/07/17 (!) 144/86  06/05/17 116/86    Wt Readings from Last 3 Encounters:  09/04/17 154 lb (69.9 kg)  08/07/17 152 lb 1.9 oz (69 kg)  06/05/17 150 lb 9.6 oz (68.3 kg)    General appearance: alert,  cooperative and appears stated age Ears: normal TM's and external ear canals both ears Throat: lips, mucosa, and tongue normal; teeth and gums normal Neck: no adenopathy, no carotid bruit, supple, symmetrical, trachea midline and thyroid not enlarged, symmetric, no tenderness/mass/nodules Back: symmetric, no curvature. ROM normal. No CVA tenderness. Lungs: clear to auscultation bilaterally Heart: regular rate and rhythm, S1, S2 normal, no murmur, click, rub or gallop Abdomen: soft, non-tender; bowel sounds normal; no masses,  no organomegaly Pulses: 2+ and symmetric Skin: Skin color, texture, turgor normal. No rashes or lesions Lymph nodes: Cervical, supraclavicular, and axillary nodes normal.  Lab Results  Component Value Date   HGBA1C 5.2 09/04/2017    Lab Results  Component Value Date   CREATININE 0.69 09/04/2017   CREATININE 0.76 06/05/2017   CREATININE 0.79 12/19/2016    Lab Results  Component Value Date   WBC 7.0 12/19/2016   HGB 14.1 12/19/2016   HCT 41.8 12/19/2016   PLT 302.0 12/19/2016   GLUCOSE 93 09/04/2017   CHOL 210 (H) 12/19/2016   TRIG 59.0 12/19/2016   HDL 101.10 12/19/2016   LDLCALC 97 12/19/2016   ALT 14 09/04/2017   AST 21 09/04/2017   NA 139 09/04/2017   K 3.4 (L) 09/04/2017   CL 102 09/04/2017   CREATININE 0.69 09/04/2017   BUN 17 09/04/2017   CO2 29 09/04/2017   TSH 1.65 09/04/2017   HGBA1C 5.2 09/04/2017    No results found.  Assessment & Plan:   Problem List Items Addressed This Visit    Anorexia nervosa,restricting type, in partial remission, severe    She continues to restrict her nutritional intake to one meal per day in the evening , and the mal is often < 500 calories .  She is unable to afford the cost of an Eating Disorders clinic due to insurance non payment.  Discussed referral to local therapist to offer any available psychotherapy.  No medication changes today,  Encouraged to add a protein shake in the morning. She is hesitant  but agrees to do so.       Hypertension    Well controlled on current regimen. Renal function stable, no changes today.       Hypothyroidism - Primary    Thyroid function is WNL on current dose of 88 mcg .  No current changes needed.       Relevant Orders   TSH (Completed)    Other Visit Diagnoses    Hypoglycemia       Relevant Orders   Hemoglobin A1c (Completed)   Comprehensive metabolic panel (Completed)      I have discontinued Allison Coffey's fluticasone, chlorpheniramine-HYDROcodone, benzonatate, and amoxicillin-clavulanate. I am also having her maintain her hydrochlorothiazide, amLODipine, ALPRAZolam, levothyroxine, buPROPion, and losartan.  No orders of the defined types were placed in this encounter.   Medications Discontinued During This Encounter  Medication Reason  . amoxicillin-clavulanate (AUGMENTIN) 875-125 MG  tablet Completed Course  . benzonatate (TESSALON PERLES) 100 MG capsule Completed Course  . chlorpheniramine-HYDROcodone (TUSSIONEX PENNKINETIC ER) 10-8 MG/5ML SUER Completed Course  . fluticasone (FLONASE) 50 MCG/ACT nasal spray Completed Course    Follow-up: No Follow-up on file.   Sherlene Shams, MD

## 2017-09-04 NOTE — Patient Instructions (Addendum)
  You should try using [employer]'s Sinus rinse ;  It is a stong sinus "flush" using water and medicated salts.  Do it over the sink because it can be a bit messy  .Taking an antibiotic can create an imbalance in the normal population of bacteria that live in the small intestine.  This imbalance can persist for 3 months.   Taking a probiotic ( Align, Floraque or Culturelle), the generic version of one of these over the counter medications, or an alternative form (kombucha,  Yogurt, or another dietary source) for a minimum of 3 weeks may help prevent a serious antibiotic associated diarrhea  Called clostridium dificile colitis that occurs when the bacteria population is altered .  Taking a probiotic may also prevent vaginitis due to yeast infections and can be continued indefinitely if you feel that it improves your digestion or your elimination (bowels).      Please  try a premixed protein drink called Premier Protein shake for breakfast or late night snack . It is great tasting,   very low sugar and available of < $2 serving at [facility] and  In bulk for $1.50/serving at [employer] and [employer]  .    Nutritional analysis :  160 cal  30 g protein  1 g sugar 50% calcium needs   [employer] Mart and [employer]

## 2017-09-07 MED ORDER — MAGNESIUM OXIDE 400 MG PO CAPS: 1.0000 | ORAL_CAPSULE | Freq: Every day | ORAL | 0 refills | Status: DC

## 2017-09-07 NOTE — Assessment & Plan Note (Signed)
Thyroid function is WNL on current dose of 88 mcg .  No current changes needed.

## 2017-09-07 NOTE — Assessment & Plan Note (Addendum)
She continues to restrict her nutritional intake to one meal per day in the evening , and the mal is often < 500 calories .  She is unable to afford the cost of an Eating Disorders clinic due to insurance non payment.  Discussed referral to local therapist to offer any available psychotherapy.  No medication changes today,  Encouraged to add a protein shake in the morning. She is hesitant but agrees to do so.

## 2017-09-07 NOTE — Assessment & Plan Note (Signed)
Well controlled on current regimen. Renal function stable, no changes today. 

## 2017-09-10 ENCOUNTER — Other Ambulatory Visit: Payer: Self-pay

## 2017-09-10 MED ORDER — FLUTICASONE PROPIONATE 50 MCG/ACT NA SUSP: NASAL | 0 refills | Status: DC

## 2017-09-16 ENCOUNTER — Other Ambulatory Visit: Payer: Self-pay | Admitting: Internal Medicine

## 2017-09-16 NOTE — Telephone Encounter (Signed)
refilled 

## 2017-09-16 NOTE — Telephone Encounter (Signed)
Okay to refill? Last refilled on: 06/05/17 for #0 with 1 refill.  LOV: 09/04/17 NOV: not scheduled

## 2017-09-16 NOTE — Telephone Encounter (Signed)
Rx faxed to [facility] 

## 2017-09-19 ENCOUNTER — Telehealth: Payer: Self-pay

## 2017-09-19 ENCOUNTER — Ambulatory Visit: Payer: BLUE CROSS/BLUE SHIELD | Admitting: Family Medicine

## 2017-09-19 ENCOUNTER — Encounter: Payer: Self-pay | Admitting: Family Medicine

## 2017-09-19 VITALS — BP 106/60 | HR 78 | Temp 97.6°F | Resp 16 | Ht 64.0 in | Wt 153.4 lb

## 2017-09-19 DIAGNOSIS — M7918 Myalgia, other site: Secondary | ICD-10-CM

## 2017-09-19 MED ORDER — NAPROXEN 250 MG PO TABS: 250.0000 mg | ORAL_TABLET | Freq: Two times a day (BID) | ORAL | 0 refills | Status: DC

## 2017-09-19 MED ORDER — CAPSAICIN-MENTHOL-METHYL SAL 0.025-1-12 % EX CREA: 1.0000 "application " | TOPICAL_CREAM | Freq: Two times a day (BID) | CUTANEOUS | 0 refills | Status: DC

## 2017-09-19 NOTE — Progress Notes (Signed)
Pre-visit discussion using our clinic review tool. No additional management support is needed unless otherwise documented below in the visit note.  

## 2017-09-19 NOTE — Progress Notes (Signed)
Subjective:    Patient ID: Allison Coffey, female    DOB: 07/17/1961, 57 y.o.   MRN: [id number]  HPI  Ms. Szydlowski is a 57 year old that presents with muscle/rib pain left sided that has been present for 2 weeks.  Pain is aggravated with sitting up and working out. She describes this pain as "pulling or tugging" that can be sharp and rated as a high as an 8 but can also be a zero. She reports feeling this pain when doing crunches during her workout or reaching up overhead for stretches. Pull-ups can also cause this discomfort. She denies fever, chills, sweats, chest pain, palpitations, SOB, cough, weakness, numbness, erythema, edema, or warmth to area.  She also reports lifting her grandchild who is one year old and 29 lbs which can further cause discomfort.   Review of Systems  Constitutional: Negative for chills, fatigue and fever.  Respiratory: Negative for cough, shortness of breath and wheezing.   Cardiovascular: Negative for chest pain and palpitations.  Gastrointestinal: Negative for abdominal pain, diarrhea, nausea and vomiting.  Musculoskeletal: Negative for back pain.       Left sided rib pain  Skin: Negative for rash.  Neurological: Negative for dizziness, weakness, light-headedness, numbness and headaches.  Psychiatric/Behavioral:       Denies depressed or anxious mood today   Past Medical History:  Diagnosis Date  . Anorexia 1988  . Hyperlipidemia   . Hypertension   . Syncope y-4     Social History   Socioeconomic History  . Marital status: Married    Spouse name: Not on file  . Number of children: Not on file  . Years of education: Not on file  . Highest education level: Not on file  Social Needs  . Financial resource strain: Not on file  . Food insecurity - worry: Not on file  . Food insecurity - inability: Not on file  . Transportation needs - medical: Not on file  . Transportation needs - non-medical: Not on file  Occupational History  . Not on file    Tobacco Use  . Smoking status: Former Smoker    Last attempt to quit: 01/19/1995    Years since quitting: 22.6  . Smokeless tobacco: Never Used  Substance and Sexual Activity  . Alcohol use: Yes    Alcohol/week: 0.0 oz    Comment: occasionally  . Drug use: No  . Sexual activity: Yes  Other Topics Concern  . Not on file  Social History Narrative  . Not on file    Past Surgical History:  Procedure Laterality Date  . ABDOMINAL HYSTERECTOMY  2012   Uterus only.  . CHOLECYSTECTOMY  1989  . ROTATOR CUFF REPAIR Left 2012    Family History  Problem Relation Age of Onset  . Heart disease Mother 3078  . Stroke Mother [id number]  . Stroke Maternal Grandmother   . Heart disease Maternal Grandmother   . Heart disease Paternal Grandmother   . Heart disease Paternal Grandfather   . Breast cancer Neg Hx     No Known Allergies  Current Outpatient Medications on File Prior to Visit  Medication Sig Dispense Refill  . ALPRAZolam (XANAX) 0.5 MG tablet TAKE 1 TABLET BY MOUTH AT BEDTIME AS NEEDED FOR ANXIETY 30 tablet 5  . amLODipine (NORVASC) 5 MG tablet TAKE 1 TABLET (5 MG TOTAL) BY MOUTH DAILY. 90 tablet 1  . buPROPion (WELLBUTRIN XL) 150 MG 24 hr tablet Take 1 tablet (150 mg  total) by mouth daily. 90 tablet 0  . hydrochlorothiazide (MICROZIDE) 12.5 MG capsule TAKE 1 CAPSULE EVERY DAY 30 capsule 2  . levothyroxine (SYNTHROID, LEVOTHROID) 88 MCG tablet Take 1 tablet (88 mcg total) by mouth daily before breakfast. 90 tablet 1  . losartan (COZAAR) 50 MG tablet TAKE 1 TABLET BY MOUTH EVERY DAY 90 tablet 0  . Magnesium Oxide 400 MG CAPS Take 1 capsule (400 mg total) by mouth daily. With food 90 capsule 0  . fluticasone (FLONASE) 50 MCG/ACT nasal spray 2 SPRAYS INTO EACH NOSTRIL DAILY. (Patient not taking: Reported on 09/19/2017) 16 g 0   No current facility-administered medications on file prior to visit.     BP 106/60 (BP Location: Left Arm, Patient Position: Sitting, Cuff Size: Normal)   Pulse 78    Temp 97.6 F (36.4 C) (Oral)   Resp 16   Ht 5\' 4"  (1.626 m)   Wt 153 lb 6 oz (69.6 kg)   SpO2 98%   BMI 26.33 kg/m       Objective:   Physical Exam  Constitutional: She is oriented to person, place, and time. She appears well-developed and well-nourished.  Eyes: Pupils are equal, round, and reactive to light. No scleral icterus.  Neck: Neck supple.  Cardiovascular: Normal rate, regular rhythm and intact distal pulses.  Pulmonary/Chest: Effort normal and breath sounds normal. She has no wheezes. She has no rales.  Abdominal: Soft. Bowel sounds are normal. There is no tenderness. There is no rigidity, no rebound, no guarding, no CVA tenderness, no tenderness at McBurney's point and negative Murphy's sign.  Musculoskeletal: Normal range of motion. She exhibits no edema.  Tenderness to palpation with localized muscle tension noted on left side of lower rib cage. No erythema, warmth, or localized edema noted.   Lymphadenopathy:    She has no cervical adenopathy.  Neurological: She is alert and oriented to person, place, and time.  Skin: Skin is warm and dry. No rash noted.  Psychiatric: She has a normal mood and affect. Her behavior is normal. Judgment and thought content normal.       Assessment & Plan:  1. Musculoskeletal pain Exam and history are reassuring. History of extensive exercise program including core work outs and history of anorexia nervosa, restricting type. Suspect that pain is musculoskeletal in nature. Can also consider possible costochondral/lower rib pain syndrome also. No heat, erythema, or localized swelling present. Pain is reproducible to palpation, localized muscle tension, described as stinging, and no cough present which is reassuring. Will trial antiinflammatories and use of topical capsaicin for symptom relief. Reviewed the importance of eating food with this type of medication and avoid taking this medication on an empty stomach. She verbalized understanding  and agreement to this plan. Advised altering exercise program during treatment to improve symptoms and close follow up if symptom does not improve with treatment, worsens, or new symptoms develop.   - naproxen (NAPROSYN) 250 MG tablet; Take 1 tablet (250 mg total) by mouth 2 (two) times daily with a meal.  Dispense: 60 tablet; Refill: 0 - Capsaicin-Menthol-Methyl Sal (CAPSAICIN-METHYL SAL-MENTHOL) 0.025-1-12 % CREA; Apply 1 application topically 2 (two) times daily.  Dispense: 60 g; Refill: 0  Roddie Mc, FNP-C

## 2017-09-19 NOTE — Telephone Encounter (Signed)
Copied from CRM [id number]. Topic: Referral - Status >> Sep 19, 2017  2:30 PM Trula SladeWalter, Linda F wrote: Reason for CRM:  Patient would like to know the status of her referral.

## 2017-09-19 NOTE — Patient Instructions (Signed)
Please take medication with food as directed. If symptoms do not improve with treatment, worsen, or you develop new symptoms such as fever, shortness of breath, or increasing pain, please seek medical attention.   Musculoskeletal Pain Musculoskeletal pain is muscle and bone aches and pains. This pain can occur in any part of the body. Follow these instructions at home:  Only take medicines for pain, discomfort, or fever as told by your health care provider.  You may continue all activities unless the activities cause more pain. When the pain lessens, slowly resume normal activities. Gradually increase the intensity and duration of the activities or exercise.  During periods of severe pain, bed rest may be helpful. Lie or sit in any position that is comfortable, but get out of bed and walk around at least every several hours.  If directed, put ice on the injured area. ? Put ice in a plastic bag. ? Place a towel between your skin and the bag. ? Leave the ice on for 20 minutes, 2-3 times a day. Contact a health care provider if:  Your pain is getting worse.  Your pain is not relieved with medicines.  You lose function in the area of the pain if the pain is in your arms, legs, or neck. This information is not intended to replace advice given to you by your health care provider. Make sure you discuss any questions you have with your health care provider. Document Released: 08/13/2005 Document Revised: 01/24/2016 Document Reviewed: 04/17/2013 Elsevier Interactive Patient Education  2017 [employer]

## 2017-10-01 ENCOUNTER — Other Ambulatory Visit: Payer: Self-pay | Admitting: Internal Medicine

## 2017-10-12 ENCOUNTER — Other Ambulatory Visit: Payer: Self-pay | Admitting: Internal Medicine

## 2017-10-16 ENCOUNTER — Other Ambulatory Visit: Payer: Self-pay

## 2017-10-16 MED ORDER — LOSARTAN POTASSIUM 50 MG PO TABS: 50.0000 mg | ORAL_TABLET | Freq: Every day | ORAL | 1 refills | Status: DC

## 2017-10-30 ENCOUNTER — Other Ambulatory Visit: Payer: Self-pay | Admitting: Family Medicine

## 2017-10-30 DIAGNOSIS — M7918 Myalgia, other site: Secondary | ICD-10-CM

## 2017-11-06 ENCOUNTER — Ambulatory Visit
Admission: EM | Admit: 2017-11-06 | Discharge: 2017-11-06 | Disposition: A | Discharge status: Home / Self Care | Payer: BLUE CROSS/BLUE SHIELD | Attending: Family Medicine | Admitting: Family Medicine

## 2017-11-06 ENCOUNTER — Other Ambulatory Visit: Payer: Self-pay

## 2017-11-06 DIAGNOSIS — B9789 Other viral agents as the cause of diseases classified elsewhere: Secondary | ICD-10-CM | POA: Diagnosis not present

## 2017-11-06 DIAGNOSIS — J069 Acute upper respiratory infection, unspecified: Secondary | ICD-10-CM | POA: Diagnosis not present

## 2017-11-06 MED ORDER — PREDNISONE 20 MG PO TABS: 20.0000 mg | ORAL_TABLET | Freq: Every day | ORAL | 0 refills | Status: DC

## 2017-11-06 NOTE — ED Triage Notes (Signed)
Pt reports not feeling well since Sunday. Has had bilateral ear pain, headache, nausea. Pain 8/10

## 2017-11-06 NOTE — ED Provider Notes (Signed)
[facility]    CSN: [id number] Arrival date & time: 11/06/17  0841     History   Chief Complaint Chief Complaint  Patient presents with  . Otalgia  . Headache    HPI BLIA TOTMAN is a 57 y.o. female.   The history is provided by the patient.  Otalgia  Associated symptoms: congestion, headaches and rhinorrhea   Headache  Associated symptoms: congestion, ear pain and URI   URI  Presenting symptoms: congestion, ear pain and rhinorrhea   Severity:  Moderate Onset quality:  Sudden Duration:  3 days Timing:  Constant Progression:  Worsening Chronicity:  New Relieved by:  None tried Ineffective treatments:  None tried Associated symptoms: headaches   Associated symptoms: no wheezing   Risk factors: sick contacts   Risk factors: not elderly, no chronic cardiac disease, no chronic kidney disease, no chronic respiratory disease, no diabetes mellitus, no immunosuppression, no recent illness and no recent travel     Past Medical History:  Diagnosis Date  . Anorexia 1988  . Hyperlipidemia   . Hypertension   . Syncope y-4    Patient Active Problem List   Diagnosis Date Noted  . GAD (generalized anxiety disorder) 04/15/2016  . 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 Hypertension 01/27/2016  . Visit for preventive health examination 12/10/2015  . S/P abdominal supracervical subtotal hysterectomy 12/07/2015  . Anorexia nervosa,restricting type, in partial remission, severe 01/23/2015  . Hypothyroidism 01/23/2015    Past Surgical History:  Procedure Laterality Date  . ABDOMINAL HYSTERECTOMY  2012   Uterus only.  . CHOLECYSTECTOMY  1989  . ROTATOR CUFF REPAIR Left 2012    OB History    No data available       Home Medications    Prior to Admission medications   Medication Sig Start Date End Date Taking? Authorizing Provider  ALPRAZolam Prudy Feeler) 0.5 MG tablet TAKE 1 TABLET BY MOUTH AT BEDTIME AS NEEDED FOR ANXIETY 09/16/17   Sherlene Shams, MD  amLODipine (NORVASC) 5 MG tablet TAKE 1 TABLET (5 MG TOTAL) BY MOUTH DAILY. 05/15/17   Sherlene Shams, MD  buPROPion (WELLBUTRIN XL) 150 MG 24 hr tablet TAKE 1 TABLET BY MOUTH EVERY DAY 10/01/17   Sherlene Shams, MD  Capsaicin-Menthol-Methyl Sal (CAPSAICIN-METHYL SAL-MENTHOL) 0.025-1-12 % CREA Apply 1 application topically 2 (two) times daily. 09/19/17   Roddie Mc, FNP  fluticasone (FLONASE) 50 MCG/ACT nasal spray SPRAY 2 SPRAYS INTO EACH NOSTRIL EVERY DAY 10/14/17   Sherlene Shams, MD  hydrochlorothiazide (MICROZIDE) 12.5 MG capsule TAKE 1 CAPSULE EVERY DAY 01/11/17   Sherlene Shams, MD  levothyroxine (SYNTHROID, LEVOTHROID) 88 MCG tablet Take 1 tablet (88 mcg total) by mouth daily before breakfast. 06/06/17   Sherlene Shams, MD  losartan (COZAAR) 50 MG tablet Take 1 tablet (50 mg total) by mouth daily. 10/16/17   Sherlene Shams, MD  Magnesium Oxide 400 MG CAPS Take 1 capsule (400 mg total) by mouth daily. With food 09/07/17   Sherlene Shams, MD  naproxen (NAPROSYN) 250 MG tablet Take 1 tablet (250 mg total) by mouth 2 (two) times daily with a meal. 09/19/17   Kordsmeier, Amil Amen, FNP  predniSONE (DELTASONE) 20 MG tablet Take 1 tablet (20 mg total) by mouth daily. 11/06/17   Payton Mccallum, MD    Family History Family History  Problem Relation Age of Onset  . Heart disease Mother 75  . Stroke Mother 67  . Stroke Maternal Grandmother   .  Heart disease Maternal Grandmother   . Heart disease Paternal Grandmother   . Heart disease Paternal Grandfather   . Breast cancer Neg Hx     Social History Social History   Tobacco Use  . Smoking status: Former Smoker    Last attempt to quit: 01/19/1995    Years since quitting: 22.8  . Smokeless tobacco: Never Used  Substance Use Topics  . Alcohol use: Yes    Alcohol/week: 0.0 oz    Comment: occasionally  . Drug use: No     Allergies   Patient has no known allergies.   Review of Systems Review of Systems  HENT: Positive for  congestion, ear pain and rhinorrhea.   Respiratory: Negative for wheezing.   Neurological: Positive for headaches.     Physical Exam Triage Vital Signs ED Triage Vitals  Enc Vitals Group     BP 11/06/17 0901 (!) 153/109     Pulse Rate 11/06/17 0859 69     Resp 11/06/17 0859 16     Temp 11/06/17 0859 97.9 F (36.6 C)     Temp Source 11/06/17 0859 Oral     SpO2 11/06/17 0859 99 %     Weight 11/06/17 0901 130 lb (59 kg)     Height 11/06/17 0901 5\' 4"  (1.626 m)     Head Circumference --      Peak Flow --      Pain Score 11/06/17 0900 8     Pain Loc --      Pain Edu? --      Excl. in GC? --    No data found.  Updated Vital Signs BP (!) 153/109 (BP Location: Right Arm)   Pulse 69   Temp 97.9 F (36.6 C) (Oral)   Resp 16   Ht 5\' 4"  (1.626 m)   Wt 130 lb (59 kg)   SpO2 99%   BMI 22.31 kg/m   Visual Acuity Right Eye Distance:   Left Eye Distance:   Bilateral Distance:    Right Eye Near:   Left Eye Near:    Bilateral Near:     Physical Exam  Constitutional: She appears well-developed and well-nourished. No distress.  HENT:  Head: Normocephalic and atraumatic.  Right Ear: Tympanic membrane, external ear and ear canal normal.  Left Ear: Tympanic membrane, external ear and ear canal normal.  Nose: Rhinorrhea present. No mucosal edema, nose lacerations, sinus tenderness, nasal deformity, septal deviation or nasal septal hematoma. No epistaxis.  No foreign bodies. Right sinus exhibits no maxillary sinus tenderness and no frontal sinus tenderness. Left sinus exhibits no maxillary sinus tenderness and no frontal sinus tenderness.  Mouth/Throat: Uvula is midline and mucous membranes are normal. Posterior oropharyngeal erythema present. No oropharyngeal exudate. No tonsillar exudate.  Eyes: Conjunctivae are normal. Right eye exhibits no discharge. Left eye exhibits no discharge. No scleral icterus.  Neck: Normal range of motion. Neck supple. No thyromegaly present.    Cardiovascular: Normal rate, regular rhythm and normal heart sounds.  Pulmonary/Chest: Effort normal and breath sounds normal. No stridor. No respiratory distress. She has no wheezes. She has no rales.  Lymphadenopathy:    She has no cervical adenopathy.  Skin: She is not diaphoretic.  Nursing note and vitals reviewed.    UC Treatments / Results  Labs (all labs ordered are listed, but only abnormal results are displayed) Labs Reviewed - No data to display  EKG  EKG Interpretation None       Radiology No results  found.  Procedures Procedures (including critical care time)  Medications Ordered in UC Medications - No data to display   Initial Impression / Assessment and Plan / UC Course  I have reviewed the triage vital signs and the nursing notes.  Pertinent labs & imaging results that were available during my care of the patient were reviewed by me and considered in my medical decision making (see chart for details).       Final Clinical Impressions(s) / UC Diagnoses   Final diagnoses:  Viral URI    ED Discharge Orders        Ordered    predniSONE (DELTASONE) 20 MG tablet  Daily     11/06/17 0957     1. diagnosis reviewed with patient 2. rx as per orders above; reviewed possible side effects, interactions, risks and benefits  3. Recommend supportive treatment with rest, fluids, otc analgesics prn 4. Follow-up prn if symptoms worsen or don't improve  Controlled Substance Prescriptions [place] Controlled Substance Registry consulted? Not Applicable   Payton Mccallum, MD 11/06/17 1018

## 2017-11-10 ENCOUNTER — Other Ambulatory Visit: Payer: Self-pay | Admitting: Internal Medicine

## 2017-11-23 ENCOUNTER — Encounter: Payer: Self-pay | Admitting: Gynecology

## 2017-11-23 ENCOUNTER — Ambulatory Visit
Admission: EM | Admit: 2017-11-23 | Discharge: 2017-11-23 | Disposition: A | Discharge status: Home / Self Care | Payer: BLUE CROSS/BLUE SHIELD | Attending: Emergency Medicine | Admitting: Emergency Medicine

## 2017-11-23 ENCOUNTER — Other Ambulatory Visit: Payer: Self-pay

## 2017-11-23 DIAGNOSIS — J069 Acute upper respiratory infection, unspecified: Secondary | ICD-10-CM

## 2017-11-23 MED ORDER — AZITHROMYCIN 250 MG PO TABS: 250.0000 mg | ORAL_TABLET | Freq: Every day | ORAL | 0 refills | Status: DC

## 2017-11-23 MED ORDER — HYDROCOD POLST-CPM POLST ER 10-8 MG/5ML PO SUER: 5.0000 mL | Freq: Two times a day (BID) | ORAL | 0 refills | Status: DC

## 2017-11-23 MED ORDER — BENZONATATE 200 MG PO CAPS: ORAL_CAPSULE | ORAL | 0 refills | Status: DC

## 2017-11-23 NOTE — ED Provider Notes (Addendum)
[facility]    CSN: [id number] Arrival date & time: 11/23/17  0804     History   Chief Complaint Chief Complaint  Patient presents with  . Cough    HPI Allison Coffey is a 57 y.o. female.   HPI  57 year old female presents with cough body aches earache and congestion that she has had for over a week.  She was seen in this clinic on 11/06/2017 ,dx with  a viral URI. This Time she tells me that 2 people that live in her house were also diagnosed with influenza.  Sincet that time she has continued to have body aches, a fever of 101.3 although that has not been recent, continuing cough and earaches with body aches.  She has been taking over-the-counter medications which has not been helping and was treated with prednisone at the visit in our clinic. Temperature is 98.2, pulse rate is 75, blood pressure 127/90 and O2 sats are 99% on room air.        Past Medical History:  Diagnosis Date  . Anorexia 1988  . Hyperlipidemia   . Hypertension   . Syncope y-4    Patient Active Problem List   Diagnosis Date Noted  . GAD (generalized anxiety disorder) 04/15/2016  . 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 Hypertension 01/27/2016  . Visit for preventive health examination 12/10/2015  . S/P abdominal supracervical subtotal hysterectomy 12/07/2015  . Anorexia nervosa,restricting type, in partial remission, severe 01/23/2015  . Hypothyroidism 01/23/2015    Past Surgical History:  Procedure Laterality Date  . ABDOMINAL HYSTERECTOMY  2012   Uterus only.  . CHOLECYSTECTOMY  1989  . ROTATOR CUFF REPAIR Left 2012    OB History   None      Home Medications    Prior to Admission medications   Medication Sig Start Date End Date Taking? Authorizing Provider  ALPRAZolam Prudy Feeler) 0.5 MG tablet TAKE 1 TABLET BY MOUTH AT BEDTIME AS NEEDED FOR ANXIETY 09/16/17  Yes Sherlene Shams, MD  amLODipine (NORVASC) 5 MG tablet TAKE 1 TABLET BY MOUTH EVERY DAY 11/11/17  Yes  Sherlene Shams, MD  buPROPion (WELLBUTRIN XL) 150 MG 24 hr tablet TAKE 1 TABLET BY MOUTH EVERY DAY 10/01/17  Yes Sherlene Shams, MD  Capsaicin-Menthol-Methyl Sal (CAPSAICIN-METHYL SAL-MENTHOL) 0.025-1-12 % CREA Apply 1 application topically 2 (two) times daily. 09/19/17  Yes Kordsmeier, Amil Amen, FNP  fluticasone (FLONASE) 50 MCG/ACT nasal spray SPRAY 2 SPRAYS INTO EACH NOSTRIL EVERY DAY 10/14/17  Yes Sherlene Shams, MD  hydrochlorothiazide (MICROZIDE) 12.5 MG capsule TAKE 1 CAPSULE EVERY DAY 01/11/17  Yes Sherlene Shams, MD  levothyroxine (SYNTHROID, LEVOTHROID) 88 MCG tablet Take 1 tablet (88 mcg total) by mouth daily before breakfast. 06/06/17  Yes Sherlene Shams, MD  losartan (COZAAR) 50 MG tablet Take 1 tablet (50 mg total) by mouth daily. 10/16/17  Yes Sherlene Shams, MD  Magnesium Oxide 400 MG CAPS Take 1 capsule (400 mg total) by mouth daily. With food 09/07/17  Yes Sherlene Shams, MD  azithromycin (ZITHROMAX) 250 MG tablet Take 1 tablet (250 mg total) by mouth daily. Take first 2 tablets together, then 1 every day until finished. 11/23/17   Lutricia Feil, PA-C  benzonatate (TESSALON) 200 MG capsule Take one cap TID PRN cough 11/23/17   Lutricia Feil, PA-C  chlorpheniramine-HYDROcodone [facility]) 10-8 MG/5ML SUER Take 5 mLs by mouth 2 (two) times daily. 11/23/17   Lutricia Feil, PA-C  Family History Family History  Problem Relation Age of Onset  . Heart disease Mother 3378  . Stroke Mother 878  . Stroke Maternal Grandmother   . Heart disease Maternal Grandmother   . Heart disease Paternal Grandmother   . Heart disease Paternal Grandfather   . Breast cancer Neg Hx     Social History Social History   Tobacco Use  . Smoking status: Former Smoker    Last attempt to quit: 01/19/1995    Years since quitting: 22.8  . Smokeless tobacco: Never Used  Substance Use Topics  . Alcohol use: Yes    Alcohol/week: 0.0 oz    Comment: occasionally  . Drug use: No      Allergies   Patient has no known allergies.   Review of Systems Review of Systems  Constitutional: Positive for activity change, chills, fatigue and fever.  HENT: Positive for congestion, postnasal drip, rhinorrhea, sinus pressure and sinus pain.   Respiratory: Positive for cough.   All other systems reviewed and are negative.    Physical Exam Triage Vital Signs ED Triage Vitals [11/23/17 0821]  Enc Vitals Group     BP 127/90     Pulse Rate 75     Resp 16     Temp 98.2 F (36.8 C)     Temp Source Oral     SpO2 99 %     Weight 130 lb (59 kg)     Height      Head Circumference      Peak Flow      Pain Score 8     Pain Loc      Pain Edu?      Excl. in GC?    No data found.  Updated Vital Signs BP 127/90 (BP Location: Left Arm)   Pulse 75   Temp 98.2 F (36.8 C) (Oral)   Resp 16   Wt 130 lb (59 kg)   SpO2 99%   BMI 22.31 kg/m   Visual Acuity Right Eye Distance:   Left Eye Distance:   Bilateral Distance:    Right Eye Near:   Left Eye Near:    Bilateral Near:     Physical Exam  Constitutional: She is oriented to person, place, and time. She appears well-developed and well-nourished. No distress.  HENT:  Head: Normocephalic.  Both TMs are dull  Eyes: Pupils are equal, round, and reactive to light. Right eye exhibits no discharge. Left eye exhibits no discharge.  Neck: Normal range of motion.  Pulmonary/Chest: Effort normal and breath sounds normal.  Musculoskeletal: Normal range of motion.  Lymphadenopathy:    She has no cervical adenopathy.  Neurological: She is alert and oriented to person, place, and time.  Skin: Skin is warm and dry. She is not diaphoretic.  Psychiatric: She has a normal mood and affect. Her behavior is normal. Judgment and thought content normal.  Nursing note and vitals reviewed.    UC Treatments / Results  Labs (all labs ordered are listed, but only abnormal results are displayed) Labs Reviewed - No data to  display  EKG None Radiology No results found.  Procedures Procedures (including critical care time)  Medications Ordered in UC Medications - No data to display   Initial Impression / Assessment and Plan / UC Course  I have reviewed the triage vital signs and the nursing notes.  Pertinent labs & imaging results that were available during my care of the patient were reviewed by me and considered  in my medical decision making (see chart for details).     Plan: 1. Test/x-ray results and diagnosis reviewed with patient 2. rx as per orders; risks, benefits, potential side effects reviewed with patient 3. Recommend supportive treatment with rest and fluids.  Tylenol or Motrin for fever body aches.  Is likely that you have had influenza type illness that has lingered presenting now with the continuing cough and body aches.  We will prescribe azithromycin due to the length of time that you have had your symptoms without them improving ; also provide you with Tessalon Perles for daytime cough and Tussionex for nighttime cough.  Recommend continued use of Flonase over 3-4-week period to facilitate drainage.  If you are not improving follow-up with your primary care physician. 4. F/u prn if symptoms worsen or don't improve   Final Clinical Impressions(s) / UC Diagnoses   Final diagnoses:  Upper respiratory tract infection, unspecified type    ED Discharge Orders        Ordered    benzonatate (TESSALON) 200 MG capsule     11/23/17 0837    chlorpheniramine-HYDROcodone (TUSSIONEX PENNKINETIC ER) 10-8 MG/5ML SUER  2 times daily     11/23/17 0837    azithromycin (ZITHROMAX) 250 MG tablet  Daily     11/23/17 0837       Controlled Substance Prescriptions [place] Controlled Substance Registry consulted? Not Applicable   Lutricia Feil, PA-C 11/23/17 0859    Lutricia Feil, PA-C 11/23/17 [phone]

## 2017-11-23 NOTE — Discharge Instructions (Addendum)
Use Flonase daily for the next 3-4 weeks.  Motrin or Tylenol as necessary for body aches or fever.

## 2017-11-23 NOTE — ED Triage Notes (Signed)
Patient c/o cough x over a week. Pt. C/o ear ace / congestion.

## 2017-11-30 ENCOUNTER — Other Ambulatory Visit: Payer: Self-pay | Admitting: Internal Medicine

## 2017-12-06 ENCOUNTER — Other Ambulatory Visit: Payer: Self-pay | Admitting: Internal Medicine

## 2017-12-18 DIAGNOSIS — M25511 Pain in right shoulder: Secondary | ICD-10-CM | POA: Diagnosis not present

## 2018-01-15 ENCOUNTER — Other Ambulatory Visit: Payer: Self-pay | Admitting: Orthopedic Surgery

## 2018-01-15 DIAGNOSIS — M25511 Pain in right shoulder: Secondary | ICD-10-CM | POA: Diagnosis not present

## 2018-01-28 DIAGNOSIS — M25511 Pain in right shoulder: Secondary | ICD-10-CM | POA: Diagnosis not present

## 2018-02-04 ENCOUNTER — Ambulatory Visit: Admission: RE | Admit: 2018-02-04 | Payer: BLUE CROSS/BLUE SHIELD | Source: Ambulatory Visit

## 2018-04-08 ENCOUNTER — Other Ambulatory Visit: Payer: Self-pay | Admitting: Internal Medicine

## 2018-04-08 NOTE — Telephone Encounter (Signed)
Refilled: 09/16/2017 Last OV: 09/04/2017 Next OV: not scheduled

## 2018-04-08 NOTE — Telephone Encounter (Signed)
Please notify patient that the prescription  was Refilled for 30 days only because it has been 9 months since last visit. .  OFFICE VISIT NEEDED prior to any more refills  

## 2018-04-10 ENCOUNTER — Other Ambulatory Visit: Payer: Self-pay | Admitting: Internal Medicine

## 2018-04-13 ENCOUNTER — Encounter: Payer: Self-pay | Admitting: Gynecology

## 2018-04-13 ENCOUNTER — Ambulatory Visit
Admission: EM | Admit: 2018-04-13 | Discharge: 2018-04-13 | Disposition: A | Discharge status: Home / Self Care | Payer: BLUE CROSS/BLUE SHIELD | Attending: Internal Medicine | Admitting: Internal Medicine

## 2018-04-13 DIAGNOSIS — M7712 Lateral epicondylitis, left elbow: Secondary | ICD-10-CM | POA: Diagnosis not present

## 2018-04-13 MED ORDER — MELOXICAM 7.5 MG PO TABS: 7.5000 mg | ORAL_TABLET | Freq: Two times a day (BID) | ORAL | 0 refills | Status: AC | PRN

## 2018-04-13 NOTE — ED Provider Notes (Signed)
[facility]    CSN: [id number] Arrival date & time: 04/13/18  0901     History   Chief Complaint Chief Complaint  Patient presents with  . Elbow Pain    HPI Allison Coffey is a 57 y.o. female.   Pain in left elbow with hand flexion x5-6 weeks. 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 Patient concerned they are not helping. Denies numbness or tingling in hand     Past Medical History:  Diagnosis Date  . Anorexia 1988  . Hyperlipidemia   . Hypertension   . Syncope y-4    Patient Active Problem List   Diagnosis Date Noted  . GAD (generalized anxiety disorder) 04/15/2016  . 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 Hypertension 01/27/2016  . Visit for preventive health examination 12/10/2015  . S/P abdominal supracervical subtotal hysterectomy 12/07/2015  . Anorexia nervosa,restricting type, in partial remission, severe 01/23/2015  . Hypothyroidism 01/23/2015    Past Surgical History:  Procedure Laterality Date  . ABDOMINAL HYSTERECTOMY  2012   Uterus only.  . CHOLECYSTECTOMY  1989  . ROTATOR CUFF REPAIR Left 2012    OB History   None      Home Medications    Prior to Admission medications   Medication Sig Start Date End Date Taking? Authorizing Provider  ALPRAZolam Prudy Feeler(XANAX) 0.5 MG tablet TAKE ONE TABLET BY MOUTH AT BEDTIME AS NEEDED FOR ANXIETY 04/08/18  Yes Sherlene Shamsullo, Teresa L, MD  amLODipine (NORVASC) 5 MG tablet TAKE 1 TABLET BY MOUTH EVERY DAY 11/11/17  Yes Sherlene Shamsullo, Teresa L, MD  buPROPion (WELLBUTRIN XL) 150 MG 24 hr tablet TAKE 1 TABLET BY MOUTH EVERY DAY 04/10/18  Yes Sherlene Shamsullo, Teresa L, MD  Capsaicin-Menthol-Methyl Sal (CAPSAICIN-METHYL SAL-MENTHOL) 0.025-1-12 % CREA Apply 1 application topically 2 (two) times daily. 09/19/17  Yes Kordsmeier, Amil AmenJulia, FNP  fluticasone (FLONASE) 50 MCG/ACT nasal spray SPRAY 2 SPRAYS INTO EACH NOSTRIL EVERY DAY 10/14/17  Yes Sherlene Shamsullo, Teresa L, MD    hydrochlorothiazide (MICROZIDE) 12.5 MG capsule TAKE 1 CAPSULE EVERY DAY 01/11/17  Yes Sherlene Shamsullo, Teresa L, MD  levothyroxine (SYNTHROID, LEVOTHROID) 88 MCG tablet TAKE 1 TABLET (88 MCG TOTAL) BY MOUTH DAILY BEFORE BREAKFAST. 12/02/17  Yes Sherlene Shamsullo, Teresa L, MD  losartan (COZAAR) 50 MG tablet TAKE 1 TABLET BY MOUTH EVERY DAY 04/10/18  Yes Sherlene Shamsullo, Teresa L, MD  Magnesium Oxide 400 (240 Mg) MG TABS TAKE 1 TABLET (400 MG TOTAL) BY MOUTH DAILY. WITH FOOD 12/09/17  Yes Sherlene Shamsullo, Teresa L, MD  azithromycin (ZITHROMAX) 250 MG tablet Take 1 tablet (250 mg total) by mouth daily. Take first 2 tablets together, then 1 every day until finished. 11/23/17   Lutricia Feiloemer, William P, PA-C  benzonatate (TESSALON) 200 MG capsule Take one cap TID PRN cough 11/23/17   Lutricia Feiloemer, William P, PA-C  chlorpheniramine-HYDROcodone [facility](TUSSIONEX PENNKINETIC ER) 10-8 MG/5ML SUER Take 5 mLs by mouth 2 (two) times daily. 11/23/17   Lutricia Feiloemer, William P, PA-C  meloxicam (MOBIC) 7.5 MG tablet Take 1 tablet (7.5 mg total) by mouth 2 (two) times daily as needed for up to 14 days for pain. Drink plenty of water; take with food 04/13/18 04/27/18  Arnaldo Nataliamond, Thresa Dozier S, MD    Family History Family History  Problem Relation Age of Onset  . Heart disease Mother 6078  . Stroke Mother 6478  . Stroke Maternal Grandmother   . Heart disease Maternal Grandmother   . Heart disease Paternal  Grandmother   . Heart disease Paternal Grandfather   . Breast cancer Neg Hx     Social History Social History   Tobacco Use  . Smoking status: Former Smoker    Last attempt to quit: 01/19/1995    Years since quitting: 23.2  . Smokeless tobacco: Never Used  Substance Use Topics  . Alcohol use: Yes    Alcohol/week: 0.0 standard drinks    Comment: occasionally  . Drug use: No     Allergies   Patient has no known allergies.   Review of Systems Review of Systems  Constitutional: Negative for chills and fever.  HENT: Negative for sore throat and tinnitus.   Eyes: Negative for  redness.  Respiratory: Negative for cough and shortness of breath.   Cardiovascular: Negative for chest pain and palpitations.  Gastrointestinal: Negative for abdominal pain, diarrhea, nausea and vomiting.  Genitourinary: Negative for dysuria, frequency and urgency.  Musculoskeletal: Positive for joint swelling. Negative for myalgias.  Skin: Negative for rash.       No lesions  Neurological: Negative for weakness.  Hematological: Does not bruise/bleed easily.  Psychiatric/Behavioral: Negative for suicidal ideas.     Physical Exam Triage Vital Signs ED Triage Vitals  Enc Vitals Group     BP 04/13/18 0916 (!) 124/94     Pulse Rate 04/13/18 0916 83     Resp --      Temp 04/13/18 0916 98.4 F (36.9 C)     Temp src --      SpO2 04/13/18 0916 100 %     Weight 04/13/18 0919 135 lb (61.2 kg)     Height 04/13/18 0919 5\' 4"  (1.626 m)     Head Circumference --      Peak Flow --      Pain Score 04/13/18 0919 8     Pain Loc --      Pain Edu? --      Excl. in GC? --    No data found.  Updated Vital Signs BP (!) 124/94 (BP Location: Right Arm)   Pulse 83   Temp 98.4 F (36.9 C)   Ht 5\' 4"  (1.626 m)   Wt 61.2 kg   SpO2 100%   BMI 23.17 kg/m   Visual Acuity Right Eye Distance:   Left Eye Distance:   Bilateral Distance:    Right Eye Near:   Left Eye Near:    Bilateral Near:     Physical Exam  Constitutional: She is oriented to person, place, and time. She appears well-developed and well-nourished. No distress.  HENT:  Head: Normocephalic and atraumatic.  Mouth/Throat: Oropharynx is clear and moist.  Eyes: Pupils are equal, round, and reactive to light. Conjunctivae and EOM are normal. No scleral icterus.  Neck: Normal range of motion. Neck supple. No JVD present. No tracheal deviation present. No thyromegaly present.  Cardiovascular: Normal rate, regular rhythm and normal heart sounds. Exam reveals no gallop and no friction rub.  No murmur heard. Pulmonary/Chest: Effort  normal and breath sounds normal.  Abdominal: Soft. Bowel sounds are normal. She exhibits no distension. There is no tenderness.  Musculoskeletal: Normal range of motion. She exhibits tenderness (pain to palpation above and below lateral epicondyle of left arm). She exhibits no edema.  Lymphadenopathy:    She has no cervical adenopathy.  Neurological: She is alert and oriented to person, place, and time. No cranial nerve deficit.  Skin: Skin is warm and dry.  Psychiatric: She has a normal mood  and affect. Her behavior is normal. Judgment and thought content normal.  Nursing note and vitals reviewed.    UC Treatments / Results  Labs (all labs ordered are listed, but only abnormal results are displayed) Labs Reviewed - No data to display  EKG None  Radiology No results found.  Procedures Procedures (including critical care time)  Medications Ordered in UC Medications - No data to display  Initial Impression / Assessment and Plan / UC Course  I have reviewed the triage vital signs and the nursing notes.  Pertinent labs & imaging results that were available during my care of the patient were reviewed by me and considered in my medical decision making (see chart for details).     Tendonitis in left elbow. No weakness. Will try NSAIDs. Advised compression band and PT. Patient will follow up with ortho after her rotator cuff surgery next week (right shoulder).   Final Clinical Impressions(s) / UC Diagnoses   Final diagnoses:  Lateral epicondylitis of left elbow   Discharge Instructions   None    ED Prescriptions    Medication Sig Dispense Auth. Provider   meloxicam (MOBIC) 7.5 MG tablet Take 1 tablet (7.5 mg total) by mouth 2 (two) times daily as needed for up to 14 days for pain. Drink plenty of water; take with food 14 tablet Arnaldo Natal, MD     Controlled Substance Prescriptions [place] Controlled Substance Registry consulted? Not Applicable   Arnaldo Natal,  MD 04/13/18 [phone]

## 2018-04-13 NOTE — ED Triage Notes (Signed)
Per patient left elbow pain x 5-6 weeks. Patient stated seen PT and diagnose with tendonitis.

## 2018-04-25 DIAGNOSIS — G8918 Other acute postprocedural pain: Secondary | ICD-10-CM | POA: Diagnosis not present

## 2018-04-25 DIAGNOSIS — M24111 Other articular cartilage disorders, right shoulder: Secondary | ICD-10-CM | POA: Diagnosis not present

## 2018-04-25 DIAGNOSIS — M19011 Primary osteoarthritis, right shoulder: Secondary | ICD-10-CM | POA: Diagnosis not present

## 2018-04-25 DIAGNOSIS — M66821 Spontaneous rupture of other tendons, right upper arm: Secondary | ICD-10-CM | POA: Diagnosis not present

## 2018-04-25 DIAGNOSIS — M75111 Incomplete rotator cuff tear or rupture of right shoulder, not specified as traumatic: Secondary | ICD-10-CM | POA: Diagnosis not present

## 2018-04-25 DIAGNOSIS — M7551 Bursitis of right shoulder: Secondary | ICD-10-CM | POA: Diagnosis not present

## 2018-04-25 DIAGNOSIS — M7541 Impingement syndrome of right shoulder: Secondary | ICD-10-CM | POA: Diagnosis not present

## 2018-04-30 DIAGNOSIS — M25611 Stiffness of right shoulder, not elsewhere classified: Secondary | ICD-10-CM | POA: Diagnosis not present

## 2018-04-30 DIAGNOSIS — M25511 Pain in right shoulder: Secondary | ICD-10-CM | POA: Diagnosis not present

## 2018-04-30 DIAGNOSIS — M6281 Muscle weakness (generalized): Secondary | ICD-10-CM | POA: Diagnosis not present

## 2018-05-01 DIAGNOSIS — M6281 Muscle weakness (generalized): Secondary | ICD-10-CM | POA: Diagnosis not present

## 2018-05-01 DIAGNOSIS — M25511 Pain in right shoulder: Secondary | ICD-10-CM | POA: Diagnosis not present

## 2018-05-01 DIAGNOSIS — M7712 Lateral epicondylitis, left elbow: Secondary | ICD-10-CM | POA: Diagnosis not present

## 2018-05-01 DIAGNOSIS — M25611 Stiffness of right shoulder, not elsewhere classified: Secondary | ICD-10-CM | POA: Diagnosis not present

## 2018-05-09 ENCOUNTER — Other Ambulatory Visit: Payer: Self-pay | Admitting: Internal Medicine

## 2018-05-18 ENCOUNTER — Other Ambulatory Visit: Payer: Self-pay | Admitting: Internal Medicine

## 2018-05-19 NOTE — Telephone Encounter (Signed)
Refilled: 04/08/2018 Last OV: 09/04/2017 Next OV: not scheduled 

## 2018-05-20 ENCOUNTER — Other Ambulatory Visit: Payer: Self-pay | Admitting: Internal Medicine

## 2018-05-20 ENCOUNTER — Encounter: Payer: Self-pay | Admitting: Internal Medicine

## 2018-05-20 ENCOUNTER — Ambulatory Visit: Payer: BLUE CROSS/BLUE SHIELD | Admitting: Internal Medicine

## 2018-05-20 VITALS — BP 122/88 | HR 106 | Temp 98.2°F | Resp 15 | Ht 64.0 in | Wt 155.0 lb

## 2018-05-20 DIAGNOSIS — R5383 Other fatigue: Secondary | ICD-10-CM

## 2018-05-20 DIAGNOSIS — F411 Generalized anxiety disorder: Secondary | ICD-10-CM

## 2018-05-20 DIAGNOSIS — F5001 Anorexia nervosa, restricting type: Secondary | ICD-10-CM

## 2018-05-20 DIAGNOSIS — R202 Paresthesia of skin: Secondary | ICD-10-CM | POA: Diagnosis not present

## 2018-05-20 DIAGNOSIS — E034 Atrophy of thyroid (acquired): Secondary | ICD-10-CM

## 2018-05-20 DIAGNOSIS — I10 Essential (primary) hypertension: Secondary | ICD-10-CM | POA: Diagnosis not present

## 2018-05-20 DIAGNOSIS — R200 Anesthesia of skin: Secondary | ICD-10-CM | POA: Diagnosis not present

## 2018-05-20 DIAGNOSIS — E876 Hypokalemia: Secondary | ICD-10-CM

## 2018-05-20 DIAGNOSIS — E538 Deficiency of other specified B group vitamins: Secondary | ICD-10-CM

## 2018-05-20 MED ORDER — LOSARTAN POTASSIUM 50 MG PO TABS: 50.0000 mg | ORAL_TABLET | Freq: Every day | ORAL | 1 refills | Status: DC

## 2018-05-20 MED ORDER — BUPROPION HCL ER (XL) 150 MG PO TB24: 150.0000 mg | ORAL_TABLET | Freq: Every day | ORAL | 1 refills | Status: DC

## 2018-05-20 MED ORDER — ALPRAZOLAM 0.5 MG PO TABS: ORAL_TABLET | ORAL | 5 refills | Status: DC

## 2018-05-20 NOTE — Progress Notes (Addendum)
Subjective:  Patient ID: Allison Coffey, female    DOB: 06/24/1961  Age: 57 y.o. MRN: [id number]  CC: The primary encounter diagnosis was Numbness and tingling of right side of face. Diagnoses of Hypothyroidism due to acquired atrophy of thyroid, Essential hypertension, Fatigue, unspecified type, Anorexia nervosa,restricting type, in partial remission, severe, GAD (generalized anxiety disorder), B12 deficiency, and Hypokalemia were also pertinent to this visit.  HPI Allison Coffey presents for acute visit for management of hypertension,  And follow up on multiple acute and  chronic issues including hypothyroidism, hypertension,  Chronic untreated eating disorder,  And GAD with insomnia managed with alprazolam     Patient  Has been lost to follow up and has run out of hctz and losartan about 4 days ago.      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  Treated in ED for elbow pain in mid August diagnosed with lateral epicondylitis with mobic 7.5 mg using prn currently  Taking the alprazolam at bedtime .  Has not had any today  Had thyroid and amlodipine doses   Today    Had arthroscopic surgery 3 weeks ago,  Not working out because of work restrictions,  Has gained weight,  .Which has aggravated her anxiety. Very anxious and tearful .  Feels depressed. last alcohol use was Sunday.   Has not been feeling well.   Has felt woozy at times and husband notes that she has been on occasion, slurring  her words  Doing the same today but not aware, and does not feel woozy today.   Not sleeping well. For the past 5 weeks  has felt intermittent numbness and tingling  On right cheek and  jaw  , Right neck and right shoulder stops at the elbow .   Occurs throughout the day and evening,  Just while watching TV or lying down.  No fasciculations/    No major headaches except for the last 2 days with BP elevation . No vision changes .  Occasional  slurring of words noticed during today's visit   .      Outpatient Medications Prior to Visit  Medication Sig Dispense Refill  . amLODipine (NORVASC) 5 MG tablet TAKE 1 TABLET BY MOUTH EVERY DAY 90 tablet 0  . Capsaicin-Menthol-Methyl Sal (CAPSAICIN-METHYL SAL-MENTHOL) 0.025-1-12 % CREA Apply 1 application topically 2 (two) times daily. 60 g 0  . levothyroxine (SYNTHROID, LEVOTHROID) 88 MCG tablet TAKE 1 TABLET (88 MCG TOTAL) BY MOUTH DAILY BEFORE BREAKFAST. 90 tablet 1  . Magnesium Oxide 400 (240 Mg) MG TABS TAKE 1 TABLET (400 MG TOTAL) BY MOUTH DAILY. WITH FOOD 90 tablet 1  . ALPRAZolam (XANAX) 0.5 MG tablet TAKE ONE TABLET BY MOUTH AT BEDTIME AS NEEDED FOR ANXIETY 30 tablet 0  . buPROPion (WELLBUTRIN XL) 150 MG 24 hr tablet TAKE 1 TABLET BY MOUTH EVERY DAY 90 tablet 1  . losartan (COZAAR) 50 MG tablet TAKE 1 TABLET BY MOUTH EVERY DAY 90 tablet 1  . hydrochlorothiazide (MICROZIDE) 12.5 MG capsule TAKE 1 CAPSULE EVERY DAY (Patient not taking: Reported on 05/20/2018) 30 capsule 2  . azithromycin (ZITHROMAX) 250 MG tablet Take 1 tablet (250 mg total) by mouth daily. Take first 2 tablets together, then 1 every day until finished. (Patient not taking: Reported on 05/20/2018) 6 tablet 0  . benzonatate (TESSALON) 200 MG capsule Take one cap TID PRN cough (Patient not  taking: Reported on 05/20/2018) 30 capsule 0  . chlorpheniramine-HYDROcodone (TUSSIONEX PENNKINETIC ER) 10-8 MG/5ML SUER Take 5 mLs by mouth 2 (two) times daily. (Patient not taking: Reported on 05/20/2018) 115 mL 0  . fluticasone (FLONASE) 50 MCG/ACT nasal spray SPRAY 2 SPRAYS INTO EACH NOSTRIL EVERY DAY (Patient not taking: Reported on 05/20/2018) 16 g 2   No facility-administered medications prior to visit.     Review of Systems;  Patient denies headache, fevers, malaise, unintentional weight loss, skin rash, eye pain, sinus congestion and sinus pain, sore throat, dysphagia,  hemoptysis , cough, dyspnea, wheezing, chest pain,  palpitations, orthopnea, edema, abdominal pain, nausea, melena, diarrhea, constipation, flank pain, dysuria, hematuria, urinary  Frequency, nocturia, numbness, tingling, seizures,  Focal weakness, Loss of consciousness,  Tremor, insomnia, depression, anxiety, and suicidal ideation.      Objective:  BP 122/88 (BP Location: Left Arm, Patient Position: Sitting, Cuff Size: Normal)   Pulse (!) 106   Temp 98.2 F (36.8 C) (Oral)   Resp 15   Ht 5\' 4"  (1.626 m)   Wt 155 lb (70.3 kg)   BMI 26.61 kg/m   BP Readings from Last 3 Encounters:  05/20/18 122/88  04/13/18 (!) 124/94  11/23/17 127/90    Wt Readings from Last 3 Encounters:  05/20/18 155 lb (70.3 kg)  04/13/18 135 lb (61.2 kg)  11/23/17 130 lb (59 kg)    General appearance: alert, cooperative and appears stated age Ears: normal TM's and external ear canals both ears Throat: lips, mucosa, and tongue normal; teeth and gums normal Neck: no adenopathy, no carotid bruit, supple, symmetrical, trachea midline and thyroid not enlarged, symmetric, no tenderness/mass/nodules Back: symmetric, no curvature. ROM normal. No CVA tenderness. Lungs: clear to auscultation bilaterally Heart: regular rate and rhythm, S1, S2 normal, no murmur, click, rub or gallop Abdomen: soft, non-tender; bowel sounds normal; no masses,  no organomegaly Pulses: 2+ and symmetric Skin: Skin color, texture, turgor normal. No rashes or lesions Lymph nodes: Cervical, supraclavicular, and axillary nodes normal. Neuro: CNs 2-12 intact. DTRs 2+/4 in biceps, brachioradialis, patellars and achilles. Muscle strength 5/5 in upper and lower exremities. Fine resting tremor bilaterally both hands cerebellar function normal. Romberg negative.  No pronator drift.   Gait normal.   Lab Results  Component Value Date   HGBA1C 5.2 09/04/2017    Lab Results  Component Value Date   CREATININE 0.88 05/20/2018   CREATININE 0.69 09/04/2017   CREATININE 0.76 06/05/2017    Lab  Results  Component Value Date   WBC 7.0 05/20/2018   HGB 15.3 (H) 05/20/2018   HCT 44.6 05/20/2018   PLT 328.0 05/20/2018   GLUCOSE 101 (H) 05/20/2018   CHOL 210 (H) 12/19/2016   TRIG 59.0 12/19/2016   HDL 101.10 12/19/2016   LDLCALC 97 12/19/2016   ALT 17 05/20/2018   AST 22 05/20/2018   NA 141 05/20/2018   K 3.4 (L) 05/20/2018   CL 103 05/20/2018   CREATININE 0.88 05/20/2018   BUN 13 05/20/2018   CO2 22 05/20/2018   TSH 2.20 05/20/2018   HGBA1C 5.2 09/04/2017    No results found.  Assessment & Plan:   Problem List Items Addressed This Visit    Anorexia nervosa,restricting type, in partial remission, severe    Encouraged her to return to UC for treament      B12 deficiency    Her B12 is low she will need to in for b12 injections weely for 3 weeks,  And she can  learn how to do them herself, .    Lab Results  Component Value Date   VITAMINB12 182 (L) 05/20/2018         GAD (generalized anxiety disorder)    Refills on alprazolam given.  Wellbutrin.  A total of 40 minutes was spent with patient more than half of which was spent in counseling patient on the above mentioned issues , reviewing and explaining recent labs and imaging studies done, and coordination of care.       Relevant Medications   ALPRAZolam (XANAX) 0.5 MG tablet   buPROPion (WELLBUTRIN XL) 150 MG 24 hr tablet   Hypertension    Resume losartan and amlodipine    Lab Results  Component Value Date   NA 141 05/20/2018   K 3.4 (L) 05/20/2018   CL 103 05/20/2018   CO2 22 05/20/2018   Lab Results  Component Value Date   CREATININE 0.88 05/20/2018         Relevant Medications   losartan (COZAAR) 50 MG tablet   Other Relevant Orders   Comprehensive metabolic panel (Completed)   Hypokalemia    Recurrent,  Checking mg level.       Hypothyroidism    Other Visit Diagnoses    Numbness and tingling of right side of face    -  Primary   Relevant Orders   Vitamin B12 (Completed)   Fatigue,  unspecified type       Relevant Orders   TSH (Completed)   CBC (Completed)      I have discontinued Allison Coffey's fluticasone, benzonatate, chlorpheniramine-HYDROcodone, and azithromycin. I have also changed her losartan and buPROPion. Additionally, I am having her start on potassium chloride SA. Lastly, I am having her maintain her hydrochlorothiazide, Capsaicin-Menthol-Methyl Sal, levothyroxine, Magnesium Oxide, amLODipine, and ALPRAZolam.  Meds ordered this encounter  Medications  . losartan (COZAAR) 50 MG tablet    Sig: Take 1 tablet (50 mg total) by mouth daily.    Dispense:  90 tablet    Refill:  1  . ALPRAZolam (XANAX) 0.5 MG tablet    Sig: TAKE ONE TABLET BY MOUTH AT BEDTIME AS NEEDED FOR ANXIETY    Dispense:  30 tablet    Refill:  5  . buPROPion (WELLBUTRIN XL) 150 MG 24 hr tablet    Sig: Take 1 tablet (150 mg total) by mouth daily.    Dispense:  90 tablet    Refill:  1  . potassium chloride SA (K-DUR,KLOR-CON) 20 MEQ tablet    Sig: Take 1 tablet (20 mEq total) by mouth daily.    Dispense:  5 tablet    Refill:  0    Medications Discontinued During This Encounter  Medication Reason  . azithromycin (ZITHROMAX) 250 MG tablet Completed Course  . benzonatate (TESSALON) 200 MG capsule Completed Course  . chlorpheniramine-HYDROcodone (TUSSIONEX PENNKINETIC ER) 10-8 MG/5ML SUER Completed Course  . fluticasone (FLONASE) 50 MCG/ACT nasal spray Completed Course  . losartan (COZAAR) 50 MG tablet Reorder  . ALPRAZolam (XANAX) 0.5 MG tablet Reorder  . buPROPion (WELLBUTRIN XL) 150 MG 24 hr tablet Reorder    Follow-up: No follow-ups on file.   Sherlene Shams, MD

## 2018-05-20 NOTE — Telephone Encounter (Signed)
Refill denied.  mychart message sent

## 2018-05-20 NOTE — Telephone Encounter (Signed)
Denied,  See note in chart

## 2018-05-20 NOTE — Telephone Encounter (Signed)
Refilled: 04/08/2018 Last OV: 09/04/2017 Next OV: not scheduled

## 2018-05-21 LAB — COMPREHENSIVE METABOLIC PANEL
ALBUMIN: 4.5 g/dL (ref 3.5–5.2)
ALK PHOS: 78 U/L (ref 39–117)
ALT: 17 U/L (ref 0–35)
AST: 22 U/L (ref 0–37)
BILIRUBIN TOTAL: 0.5 mg/dL (ref 0.2–1.2)
BUN: 13 mg/dL (ref 6–23)
CO2: 22 mEq/L (ref 19–32)
CREATININE: 0.88 mg/dL (ref 0.40–1.20)
Calcium: 9.7 mg/dL (ref 8.4–10.5)
Chloride: 103 mEq/L (ref 96–112)
GFR: 70.3 mL/min (ref >60.00)
Glucose, Bld: 101 mg/dL — ABNORMAL HIGH (ref 70–99)
Potassium: 3.4 mEq/L — ABNORMAL LOW (ref 3.5–5.1)
SODIUM: 141 meq/L (ref 135–145)
TOTAL PROTEIN: 7.7 g/dL (ref 6.0–8.3)

## 2018-05-21 LAB — VITAMIN B12: VITAMIN B 12: 182 pg/mL — AB (ref 211–911)

## 2018-05-21 LAB — CBC
HCT: 44.6 % (ref 36.0–46.0)
Hemoglobin: 15.3 g/dL — ABNORMAL HIGH (ref 12.0–15.0)
MCHC: 34.2 g/dL (ref 30.0–36.0)
MCV: 95.2 fl (ref 78.0–100.0)
Platelets: 328 10*3/uL (ref 150.0–400.0)
RBC: 4.69 Mil/uL (ref 3.87–5.11)
RDW: 12.9 % (ref 11.5–15.5)
WBC: 7 10*3/uL (ref 4.0–10.5)

## 2018-05-21 LAB — TSH: TSH: 2.2 u[IU]/mL (ref 0.35–4.50)

## 2018-05-22 ENCOUNTER — Ambulatory Visit (INDEPENDENT_AMBULATORY_CARE_PROVIDER_SITE_OTHER): Payer: BLUE CROSS/BLUE SHIELD

## 2018-05-22 ENCOUNTER — Other Ambulatory Visit: Payer: Self-pay | Admitting: Internal Medicine

## 2018-05-22 DIAGNOSIS — F411 Generalized anxiety disorder: Secondary | ICD-10-CM

## 2018-05-22 DIAGNOSIS — E538 Deficiency of other specified B group vitamins: Secondary | ICD-10-CM

## 2018-05-22 DIAGNOSIS — E876 Hypokalemia: Secondary | ICD-10-CM | POA: Insufficient documentation

## 2018-05-22 DIAGNOSIS — F5001 Anorexia nervosa, restricting type: Secondary | ICD-10-CM

## 2018-05-22 MED ORDER — CYANOCOBALAMIN 1000 MCG/ML IJ SOLN
1000.0000 ug | Freq: Once | INTRAMUSCULAR | Status: AC
  Administered 2018-05-22: 1000 ug via INTRAMUSCULAR

## 2018-05-22 MED ORDER — POTASSIUM CHLORIDE CRYS ER 20 MEQ PO TBCR: 20.0000 meq | EXTENDED_RELEASE_TABLET | Freq: Every day | ORAL | 0 refills | Status: DC

## 2018-05-22 NOTE — Addendum Note (Signed)
Addended by: Sherlene Shams on: 05/22/2018 12:39 AM   Modules accepted: Orders

## 2018-05-22 NOTE — Progress Notes (Signed)
Patient comes in for  1 st B 12 injection.  Injected left deltoid.   Patient tolerated injection well.   Reviewed.  Dr Scott 

## 2018-05-22 NOTE — Assessment & Plan Note (Signed)
Refills on alprazolam given.  Wellbutrin.  A total of 40 minutes was spent with patient more than half of which was spent in counseling patient on the above mentioned issues , reviewing and explaining recent labs and imaging studies done, and coordination of care.

## 2018-05-22 NOTE — Assessment & Plan Note (Addendum)
Resume losartan and amlodipine    Lab Results  Component Value Date   NA 141 05/20/2018   K 3.4 (L) 05/20/2018   CL 103 05/20/2018   CO2 22 05/20/2018   Lab Results  Component Value Date   CREATININE 0.88 05/20/2018

## 2018-05-22 NOTE — Assessment & Plan Note (Signed)
Recurrent,  Checking mg level  

## 2018-05-22 NOTE — Assessment & Plan Note (Addendum)
Encouraged her to return to [facility] for treatment.  She has been reluctant to do so because of her part time job at the [employer]  and concern for [employer] well being if she leaves.

## 2018-05-22 NOTE — Progress Notes (Signed)
anb

## 2018-05-22 NOTE — Assessment & Plan Note (Addendum)
Her B12 is low she will need to in for b12 injections weely for 3 weeks,  And she can learn how to do them herself, .    Lab Results  Component Value Date   VITAMINB12 182 (L) 05/20/2018

## 2018-05-26 ENCOUNTER — Other Ambulatory Visit: Payer: Self-pay

## 2018-05-26 ENCOUNTER — Encounter: Payer: Self-pay | Admitting: Emergency Medicine

## 2018-05-26 ENCOUNTER — Telehealth: Payer: Self-pay | Admitting: Internal Medicine

## 2018-05-26 ENCOUNTER — Emergency Department
Admission: EM | Admit: 2018-05-26 | Discharge: 2018-05-26 | Disposition: A | Discharge status: Home / Self Care | Payer: BLUE CROSS/BLUE SHIELD | Attending: Student in an Organized Health Care Education/Training Program | Admitting: Student in an Organized Health Care Education/Training Program

## 2018-05-26 DIAGNOSIS — E039 Hypothyroidism, unspecified: Secondary | ICD-10-CM | POA: Diagnosis not present

## 2018-05-26 DIAGNOSIS — R200 Anesthesia of skin: Secondary | ICD-10-CM | POA: Insufficient documentation

## 2018-05-26 DIAGNOSIS — I10 Essential (primary) hypertension: Secondary | ICD-10-CM | POA: Insufficient documentation

## 2018-05-26 DIAGNOSIS — R55 Syncope and collapse: Secondary | ICD-10-CM | POA: Diagnosis not present

## 2018-05-26 DIAGNOSIS — Z87891 Personal history of nicotine dependence: Secondary | ICD-10-CM | POA: Insufficient documentation

## 2018-05-26 DIAGNOSIS — R5381 Other malaise: Secondary | ICD-10-CM | POA: Insufficient documentation

## 2018-05-26 DIAGNOSIS — Z79899 Other long term (current) drug therapy: Secondary | ICD-10-CM | POA: Diagnosis not present

## 2018-05-26 DIAGNOSIS — R5383 Other fatigue: Secondary | ICD-10-CM | POA: Diagnosis not present

## 2018-05-26 DIAGNOSIS — R000 Tachycardia, unspecified: Secondary | ICD-10-CM | POA: Insufficient documentation

## 2018-05-26 DIAGNOSIS — R6883 Chills (without fever): Secondary | ICD-10-CM | POA: Diagnosis not present

## 2018-05-26 DIAGNOSIS — R110 Nausea: Secondary | ICD-10-CM | POA: Insufficient documentation

## 2018-05-26 LAB — TROPONIN I

## 2018-05-26 LAB — CBC
HCT: 43.2 % (ref 35.0–47.0)
Hemoglobin: 15.1 g/dL (ref 12.0–16.0)
MCH: 33.4 pg (ref 26.0–34.0)
MCHC: 34.9 g/dL (ref 32.0–36.0)
MCV: 95.7 fL (ref 80.0–100.0)
Platelets: 268 10*3/uL (ref 150–440)
RBC: 4.52 MIL/uL (ref 3.80–5.20)
RDW: 12.8 % (ref 11.5–14.5)
WBC: 6.7 10*3/uL (ref 3.6–11.0)

## 2018-05-26 LAB — BASIC METABOLIC PANEL
Anion gap: 8 (ref 5–15)
BUN: 21 mg/dL — AB (ref 6–20)
CHLORIDE: 106 mmol/L (ref 98–111)
CO2: 24 mmol/L (ref 22–32)
CREATININE: 0.7 mg/dL (ref 0.44–1.00)
Calcium: 9.3 mg/dL (ref 8.9–10.3)
GFR calc non Af Amer: >60 mL/min (ref >60)
Glucose, Bld: 103 mg/dL — ABNORMAL HIGH (ref 70–99)
Potassium: 3.6 mmol/L (ref 3.5–5.1)
Sodium: 138 mmol/L (ref 135–145)

## 2018-05-26 LAB — URINALYSIS, COMPLETE (UACMP) WITH MICROSCOPIC
Bacteria, UA: NONE SEEN
Bilirubin Urine: NEGATIVE
Glucose, UA: NEGATIVE mg/dL
Hgb urine dipstick: NEGATIVE
KETONES UR: 5 mg/dL — AB
Leukocytes, UA: NEGATIVE
Nitrite: NEGATIVE
PH: 5 (ref 5.0–8.0)
PROTEIN: NEGATIVE mg/dL
Specific Gravity, Urine: 1.013 (ref 1.005–1.030)

## 2018-05-26 MED ORDER — METOPROLOL SUCCINATE ER 25 MG PO TB24: 25.0000 mg | ORAL_TABLET | Freq: Every day | ORAL | 3 refills | Status: DC

## 2018-05-26 MED ORDER — PROMETHAZINE HCL 12.5 MG PO TABS: 12.5000 mg | ORAL_TABLET | Freq: Four times a day (QID) | ORAL | 0 refills | Status: DC | PRN

## 2018-05-26 MED ORDER — PROMETHAZINE HCL 25 MG/ML IJ SOLN
12.5000 mg | Freq: Four times a day (QID) | INTRAMUSCULAR | Status: DC | PRN
  Administered 2018-05-26: 12.5 mg via INTRAVENOUS
  Filled 2018-05-26: qty 1

## 2018-05-26 MED ORDER — SODIUM CHLORIDE 0.9 % IV BOLUS
1000.0000 mL | Freq: Once | INTRAVENOUS | Status: AC
  Administered 2018-05-26: 1000 mL via INTRAVENOUS

## 2018-05-26 MED ORDER — LORAZEPAM 0.5 MG PO TABS
0.5000 mg | ORAL_TABLET | Freq: Once | ORAL | Status: AC
  Administered 2018-05-26: 0.5 mg via ORAL
  Filled 2018-05-26: qty 1

## 2018-05-26 NOTE — Telephone Encounter (Signed)
Er Records reviewed.  EKG and labs normal.   I would like to start her on Metoprolol succinate  (ER )  25 mg once daily ,  For blood pressure and elevated heart rate .  Will send to her pharmacy .  Continue losartan and amlodipine.   Needs a one week follow up with me

## 2018-05-26 NOTE — Discharge Instructions (Signed)
Follow up with PCP.  Return for any worsening symptoms, fevers, pain or for any additional questions or concerns.

## 2018-05-26 NOTE — ED Provider Notes (Signed)
[facility] Provider Note    First MD Initiated Contact with Patient 05/26/18 1302     (approximate)  I have reviewed the triage vital signs and the nursing notes.   HISTORY  Chief Complaint Near Syncope and Tachycardia    HPI Allison Coffey is a 57 y.o. female history of anorexia nervosa hypertension and fainting spells presents the ER for fatigue malaise and episode of feeling like she was about to faint at work today.  States that she has not been having much to eat lately because she has a generalized feeling of nausea.  No measured fevers.  Has had intermittent chills.  Did recently have blood work done at PCPs office which showed that she was B12 deficient and was started on B12 injections.  Does have a history of hypothyroid and is on supplemental Synthroid.  No recent changes to that.  Denies any chest pain or shortness of breath.    Past Medical History:  Diagnosis Date  . Anorexia 1988  . Hyperlipidemia   . Hypertension   . Syncope y-4   Family History  Problem Relation Age of Onset  . Heart disease Mother 88  . Stroke Mother 52  . Stroke Maternal Grandmother   . Heart disease Maternal Grandmother   . Heart disease Paternal Grandmother   . Heart disease Paternal Grandfather   . Breast cancer Neg Hx    Past Surgical History:  Procedure Laterality Date  . ABDOMINAL HYSTERECTOMY  2012   Uterus only.  . CHOLECYSTECTOMY  1989  . ROTATOR CUFF REPAIR Left 2012   Patient Active Problem List   Diagnosis Date Noted  . Right facial numbness 05/26/2018  . B12 deficiency 05/22/2018  . Hypokalemia 05/22/2018  . GAD (generalized anxiety disorder) 04/15/2016  . 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 Hypertension 01/27/2016  . Visit for preventive health examination 12/10/2015  . S/P abdominal supracervical subtotal hysterectomy 12/07/2015  . Anorexia nervosa,restricting type, in partial remission, severe 01/23/2015   . Hypothyroidism 01/23/2015      Prior to Admission medications   Medication Sig Start Date End Date Taking? Authorizing Provider  ALPRAZolam Prudy Feeler) 0.5 MG tablet TAKE ONE TABLET BY MOUTH AT BEDTIME AS NEEDED FOR ANXIETY 05/20/18   Sherlene Shams, MD  amLODipine (NORVASC) 5 MG tablet TAKE 1 TABLET BY MOUTH EVERY DAY 05/09/18   Sherlene Shams, MD  buPROPion (WELLBUTRIN XL) 150 MG 24 hr tablet Take 1 tablet (150 mg total) by mouth daily. 05/20/18   Sherlene Shams, MD  Capsaicin-Menthol-Methyl Sal (CAPSAICIN-METHYL SAL-MENTHOL) 0.025-1-12 % CREA Apply 1 application topically 2 (two) times daily. 09/19/17   Kordsmeier, Amil Amen, FNP  levothyroxine (SYNTHROID, LEVOTHROID) 88 MCG tablet TAKE 1 TABLET (88 MCG TOTAL) BY MOUTH DAILY BEFORE BREAKFAST. 12/02/17   Sherlene Shams, MD  losartan (COZAAR) 50 MG tablet Take 1 tablet (50 mg total) by mouth daily. 05/20/18   Sherlene Shams, MD  Magnesium Oxide 400 (240 Mg) MG TABS TAKE 1 TABLET (400 MG TOTAL) BY MOUTH DAILY. WITH FOOD 12/09/17   Sherlene Shams, MD  metoprolol succinate (TOPROL-XL) 25 MG 24 hr tablet Take 1 tablet (25 mg total) by mouth daily. 05/26/18   Sherlene Shams, MD  potassium chloride SA (K-DUR,KLOR-CON) 20 MEQ tablet Take 1 tablet (20 mEq total) by mouth daily. 05/22/18   Sherlene Shams, MD  promethazine (PHENERGAN) 12.5 MG tablet Take 1 tablet (12.5 mg total) by mouth every 6 (  six) hours as needed for nausea or vomiting. 05/26/18   Willy Eddy, MD    Allergies Patient has no known allergies.    Social History Social History   Tobacco Use  . Smoking status: Former Smoker    Last attempt to quit: 01/19/1995    Years since quitting: 23.3  . Smokeless tobacco: Never Used  Substance Use Topics  . Alcohol use: Yes    Alcohol/week: 0.0 standard drinks    Comment: occasionally  . Drug use: No    Review of Systems Patient denies headaches, rhinorrhea, blurry vision, numbness, shortness of breath, chest pain, edema, cough,  abdominal pain, nausea, vomiting, diarrhea, dysuria, fevers, rashes or hallucinations unless otherwise stated above in HPI. ____________________________________________   PHYSICAL EXAM:  VITAL SIGNS: Vitals:   05/26/18 1630 05/26/18 1700  BP: 124/90 (!) 133/93  Pulse: 72 80  Resp: 11 15  Temp:    SpO2: 100% 99%    Constitutional: Alert and oriented.  Eyes: Conjunctivae are normal.  Head: Atraumatic. Nose: No congestion/rhinnorhea. Mouth/Throat: Mucous membranes are moist.   Neck: No stridor. Painless ROM.  Cardiovascular: Normal rate, regular rhythm. Grossly normal heart sounds.  Good peripheral circulation. Respiratory: Normal respiratory effort.  No retractions. Lungs CTAB. Gastrointestinal: Soft and nontender. No distention. No abdominal bruits. No CVA tenderness. Genitourinary:  Musculoskeletal: No lower extremity tenderness nor edema.  No joint effusions. Neurologic:  Normal speech and language. No gross focal neurologic deficits are appreciated. No facial droop Skin:  Skin is warm, dry and intact. No rash noted. Psychiatric: Mood and affect are normal. Speech and behavior are normal.  ____________________________________________   LABS (all labs ordered are listed, but only abnormal results are displayed)  Results for orders placed or performed during the hospital encounter of 05/26/18 (from the past 24 hour(s))  Basic metabolic panel     Status: Abnormal   Collection Time: 05/26/18 11:38 AM  Result Value Ref Range   Sodium 138 135 - 145 mmol/L   Potassium 3.6 3.5 - 5.1 mmol/L   Chloride 106 98 - 111 mmol/L   CO2 24 22 - 32 mmol/L   Glucose, Bld 103 (H) 70 - 99 mg/dL   BUN 21 (H) 6 - 20 mg/dL   Creatinine, Ser 6.29 0.44 - 1.00 mg/dL   Calcium 9.3 8.9 - 52.8 mg/dL   GFR calc non Af Amer >60 >60 mL/min   GFR calc Af Amer >60 >60 mL/min   Anion gap 8 5 - 15  CBC     Status: None   Collection Time: 05/26/18 11:38 AM  Result Value Ref Range   WBC 6.7 3.6 - 11.0  K/uL   RBC 4.52 3.80 - 5.20 MIL/uL   Hemoglobin 15.1 12.0 - 16.0 g/dL   HCT 41.3 24.4 - 01.0 %   MCV 95.7 80.0 - 100.0 fL   MCH 33.4 26.0 - 34.0 pg   MCHC 34.9 32.0 - 36.0 g/dL   RDW 27.2 53.6 - 64.4 %   Platelets 268 150 - 440 K/uL  Troponin I     Status: None   Collection Time: 05/26/18 11:38 AM  Result Value Ref Range   Troponin I <0.03 <0.03 ng/mL  Urinalysis, Complete w Microscopic     Status: Abnormal   Collection Time: 05/26/18 11:39 AM  Result Value Ref Range   Color, Urine YELLOW (A) YELLOW   APPearance CLEAR (A) CLEAR   Specific Gravity, Urine 1.013 1.005 - 1.030   pH 5.0 5.0 - 8.0  Glucose, UA NEGATIVE NEGATIVE mg/dL   Hgb urine dipstick NEGATIVE NEGATIVE   Bilirubin Urine NEGATIVE NEGATIVE   Ketones, ur 5 (A) NEGATIVE mg/dL   Protein, ur NEGATIVE NEGATIVE mg/dL   Nitrite NEGATIVE NEGATIVE   Leukocytes, UA NEGATIVE NEGATIVE   RBC / HPF 0-5 0 - 5 RBC/hpf   WBC, UA 0-5 0 - 5 WBC/hpf   Bacteria, UA NONE SEEN NONE SEEN   Squamous Epithelial / LPF 0-5 0 - 5   Mucus PRESENT   Troponin I     Status: None   Collection Time: 05/26/18  3:19 PM  Result Value Ref Range   Troponin I <0.03 <0.03 ng/mL   ____________________________________________  EKG My review and personal interpretation at Time: 11:30   Indication: palpitations  Rate: 80  Rhythm: sinus Axis: normal Other: normal intervals, no stemi ____________________________________________  RADIOLOGY  I personally reviewed all radiographic images ordered to evaluate for the above acute complaints and reviewed radiology reports and findings.  These findings were personally discussed with the patient.  Please see medical record for radiology report.  ____________________________________________   PROCEDURES  Procedure(s) performed:  Procedures    Critical Care performed: no ____________________________________________   INITIAL IMPRESSION / ASSESSMENT AND PLAN / ED COURSE  Pertinent labs & imaging  results that were available during my care of the patient were reviewed by me and considered in my medical decision making (see chart for details).   DDX: Dehydration, anemia, poor nutritional intake, anorexia, AKI, UTI, dysrhythmia  Allison Coffey is a 57 y.o. who presents to the ED with symptoms as described above.  Patient afebrile and hemodynamically stable.  Does appear clinically dry on exam therefore we will give IV fluids.  Mildly elevated BUN as well.  No anemia.  No evidence of infectious process.  EKG shows no evidence of dysrhythmia or ischemic changes.  The patient will be placed on continuous pulse oximetry and telemetry for monitoring.  Laboratory evaluation will be sent to evaluate for the above complaints.     Clinical Course as of May 26 2113  [facility] May 26, 2018  1636 Patient reassessed.  Symptoms improved.  Serial enzymes are negative.  No evidence of ectopy or dysrhythmia on the monitor.  At this point do believe she stable and appropriate for outpatient follow-up.   [PR]    Clinical Course User Index [PR] Willy Eddy, MD     As part of my medical decision making, I reviewed the following data within the electronic MEDICAL RECORD NUMBER Nursing notes reviewed and incorporated, Labs reviewed, notes from prior ED visits and [place] Controlled Substance Database   ____________________________________________   FINAL CLINICAL IMPRESSION(S) / ED DIAGNOSES  Final diagnoses:  Malaise and fatigue      NEW MEDICATIONS STARTED DURING THIS VISIT:  Discharge Medication List as of 05/26/2018  4:39 PM    START taking these medications   Details  promethazine (PHENERGAN) 12.5 MG tablet Take 1 tablet (12.5 mg total) by mouth every 6 (six) hours as needed for nausea or vomiting., Starting Mon 05/26/2018, Print         Note:  This document was prepared using Dragon voice recognition software and may include unintentional dictation errors.    Willy Eddy, MD 05/26/18  2114

## 2018-05-26 NOTE — ED Notes (Signed)
Pt called this nurse to her room reference "feeling bad." She was noted to be tachycardic at rate of 135 bpm. She states "I started feeling bad again." She needed to use the restroom so she was assisted to toilet x 1 assist. Steady gait. MD made aware of pt complaint.

## 2018-05-26 NOTE — Assessment & Plan Note (Signed)
Intermittent.  No fasciculations.  Neuro exam normal.  Calcium normal.  b12 low.  Unaccompanied by recurrent headaches.  Will consider MRI if symptoms continue

## 2018-05-26 NOTE — ED Triage Notes (Signed)
Pt arrived via POV from work with reports of feelings of exhaustion, lightheadedness, elevated BP, and tachycardia over the past week. Pt also saw PCP for the same.   Pt also reports cold chills over the past 2 days as well and nauseated.

## 2018-05-26 NOTE — Telephone Encounter (Signed)
LMTCB. PEC may speak with pt.  

## 2018-05-29 ENCOUNTER — Ambulatory Visit (INDEPENDENT_AMBULATORY_CARE_PROVIDER_SITE_OTHER): Payer: BLUE CROSS/BLUE SHIELD

## 2018-05-29 DIAGNOSIS — E538 Deficiency of other specified B group vitamins: Secondary | ICD-10-CM | POA: Diagnosis not present

## 2018-05-29 MED ORDER — CYANOCOBALAMIN 1000 MCG/ML IJ SOLN
1000.0000 ug | Freq: Once | INTRAMUSCULAR | Status: AC
  Administered 2018-05-29: 1000 ug via INTRAMUSCULAR

## 2018-05-29 NOTE — Progress Notes (Signed)
Pt was here today for a NV to receive B-12 shot. Given in RD pt tolerated well.

## 2018-05-31 ENCOUNTER — Other Ambulatory Visit: Payer: Self-pay | Admitting: Internal Medicine

## 2018-06-05 ENCOUNTER — Ambulatory Visit (INDEPENDENT_AMBULATORY_CARE_PROVIDER_SITE_OTHER): Payer: BLUE CROSS/BLUE SHIELD

## 2018-06-05 DIAGNOSIS — E538 Deficiency of other specified B group vitamins: Secondary | ICD-10-CM | POA: Diagnosis not present

## 2018-06-05 MED ORDER — CYANOCOBALAMIN 1000 MCG/ML IJ SOLN
1000.0000 ug | Freq: Once | INTRAMUSCULAR | Status: AC
  Administered 2018-06-05: 1000 ug via INTRAMUSCULAR

## 2018-06-05 NOTE — Progress Notes (Signed)
Pt was here today for her last B-12 shot.   Shot was given in RD pt tolerated well.   This was the patient last B-12 shot and she wanted to know should she continue the B-12 shots or have lab work done to check on her B-12 level.   Sent to PCP

## 2018-06-17 NOTE — Telephone Encounter (Signed)
Spoke with pt and she stated that she has been taking the medication for about 2 weeks now. Pt has been scheduled for a follow up on 06/20/2018. Pt is aware of appt date and time.

## 2018-06-20 ENCOUNTER — Encounter: Payer: Self-pay | Admitting: Internal Medicine

## 2018-06-20 ENCOUNTER — Ambulatory Visit (INDEPENDENT_AMBULATORY_CARE_PROVIDER_SITE_OTHER): Payer: BLUE CROSS/BLUE SHIELD | Admitting: Internal Medicine

## 2018-06-20 VITALS — BP 140/94 | HR 64 | Temp 98.3°F | Resp 15 | Ht 64.0 in | Wt 156.6 lb

## 2018-06-20 DIAGNOSIS — F50014 Anorexia nervosa, restricting type, in remission: Secondary | ICD-10-CM

## 2018-06-20 DIAGNOSIS — Z7141 Alcohol abuse counseling and surveillance of alcoholic: Secondary | ICD-10-CM

## 2018-06-20 DIAGNOSIS — E538 Deficiency of other specified B group vitamins: Secondary | ICD-10-CM | POA: Diagnosis not present

## 2018-06-20 DIAGNOSIS — F5001 Anorexia nervosa, restricting type: Secondary | ICD-10-CM | POA: Diagnosis not present

## 2018-06-20 DIAGNOSIS — E876 Hypokalemia: Secondary | ICD-10-CM

## 2018-06-20 DIAGNOSIS — E871 Hypo-osmolality and hyponatremia: Secondary | ICD-10-CM

## 2018-06-20 DIAGNOSIS — I10 Essential (primary) hypertension: Secondary | ICD-10-CM

## 2018-06-20 MED ORDER — "SYRINGE 25G X 1"" 3 ML MISC": 0 refills | Status: DC

## 2018-06-20 MED ORDER — CYANOCOBALAMIN 1000 MCG/ML IJ SOLN
1000.0000 ug | Freq: Once | INTRAMUSCULAR | Status: AC
  Administered 2018-06-20: 1000 ug via INTRAMUSCULAR

## 2018-06-20 MED ORDER — CYANOCOBALAMIN 1000 MCG/ML IJ SOLN: 1000.0000 ug | INTRAMUSCULAR | 0 refills | Status: DC

## 2018-06-20 MED ORDER — LOSARTAN POTASSIUM 50 MG PO TABS: 50.0000 mg | ORAL_TABLET | Freq: Every day | ORAL | 1 refills | Status: DC

## 2018-06-20 NOTE — Patient Instructions (Addendum)
Your fatigue should improve when we improve your insomnia and your b12 levels    You need to continue weekly b12 doses for a minimum of 4 weeks  I will send the rx to your pharmacy   Abstain from alcohol for a minimum  of 2 weeks  To see if it helps.   For the blood pressure:  Start the losartan at 50 mg daily  Continue the amlodipine  Reduce the metoprolol to 1/2 tablet daily for 3 days,  Then stop     For the eating disorder:   I will make the referral to [facility] clinic again

## 2018-06-20 NOTE — Progress Notes (Signed)
Subjective:  Patient ID: Allison Coffey, female    DOB: May 12, 1961  Age: 57 y.o. MRN: [id number]  CC: The primary encounter diagnosis was Hyponatremia. Diagnoses of B12 deficiency, Anorexia nervosa,restricting type, in partial remission, severe, Alcohol abuse counseling and surveillance, Hypokalemia, and Essential hypertension were also pertinent to this visit.  HPI Allison Coffey presents for follow up on hypertension, depression , eating disorder and chronc  B12   Deficiency . Last injection was given in office on 10/10   Cc: fatigue .  States that it is significant,  Affecting her ability to work.  Has not improved with B12 supplementation.  Doesn't sleep well.  Chronically, takes alprazolam at bedtime,  Wakes up at 2 or 3 am.   Questioned about alcohol intake given that at her last visit she was noted to have slurred speech but denied any recent alcohol use.  She does admit to drinking one margarita per night but not every night and denies drinking to excess.   Treated in ER sept 30 for fatigue, presyncope,,  Given IV fluids.   Felt better for about 12 hours , then started to feel exhausted again .    Hypertension:  Taking amlodipine 5 and metoprolol  25    Not taking losartan :  .   Outpatient Medications Prior to Visit  Medication Sig Dispense Refill  . ALPRAZolam (XANAX) 0.5 MG tablet TAKE ONE TABLET BY MOUTH AT BEDTIME AS NEEDED FOR ANXIETY 30 tablet 5  . amLODipine (NORVASC) 5 MG tablet TAKE 1 TABLET BY MOUTH EVERY DAY 90 tablet 0  . buPROPion (WELLBUTRIN XL) 150 MG 24 hr tablet Take 1 tablet (150 mg total) by mouth daily. 90 tablet 1  . Capsaicin-Menthol-Methyl Sal (CAPSAICIN-METHYL SAL-MENTHOL) 0.025-1-12 % CREA Apply 1 application topically 2 (two) times daily. 60 g 0  . levothyroxine (SYNTHROID, LEVOTHROID) 88 MCG tablet TAKE 1 TABLET (88 MCG TOTAL) BY MOUTH DAILY BEFORE BREAKFAST. 90 tablet 1  . metoprolol succinate (TOPROL-XL) 25 MG 24 hr tablet Take 1 tablet (25 mg total)  by mouth daily. 90 tablet 3  . potassium chloride SA (K-DUR,KLOR-CON) 20 MEQ tablet Take 1 tablet (20 mEq total) by mouth daily. 5 tablet 0  . losartan (COZAAR) 50 MG tablet Take 1 tablet (50 mg total) by mouth daily. (Patient not taking: Reported on 06/20/2018) 90 tablet 1  . Magnesium Oxide 400 (240 Mg) MG TABS TAKE 1 TABLET (400 MG TOTAL) BY MOUTH DAILY. WITH FOOD (Patient not taking: Reported on 06/20/2018) 90 tablet 1  . promethazine (PHENERGAN) 12.5 MG tablet Take 1 tablet (12.5 mg total) by mouth every 6 (six) hours as needed for nausea or vomiting. (Patient not taking: Reported on 06/20/2018) 12 tablet 0   No facility-administered medications prior to visit.     Review of Systems;  Patient denies headache, fevers, malaise, unintentional weight loss, skin rash, eye pain, sinus congestion and sinus pain, sore throat, dysphagia,  hemoptysis , cough, dyspnea, wheezing, chest pain, palpitations, orthopnea, edema, abdominal pain, nausea, melena, diarrhea, constipation, flank pain, dysuria, hematuria, urinary  Frequency, nocturia, numbness, tingling, seizures,  Focal weakness, Loss of consciousness,  Tremor, insomnia, depression, anxiety, and suicidal ideation.      Objective:  BP (!) 140/94 (BP Location: Left Arm, Patient Position: Sitting, Cuff Size: Normal)   Pulse 64   Temp 98.3 F (36.8 C) (Oral)   Resp 15   Ht 5\' 4"  (1.626 m)   Wt 156 lb 9.6 oz (71 kg)  SpO2 93%   BMI 26.88 kg/m   BP Readings from Last 3 Encounters:  06/20/18 (!) 140/94  05/26/18 (!) 133/93  05/20/18 122/88    Wt Readings from Last 3 Encounters:  06/20/18 156 lb 9.6 oz (71 kg)  05/26/18 140 lb (63.5 kg)  05/20/18 155 lb (70.3 kg)    General appearance: alert, cooperative and appears stated age Ears: normal TM's and external ear canals both ears Throat: lips, mucosa, and tongue normal; teeth and gums normal Neck: no adenopathy, no carotid bruit, supple, symmetrical, trachea midline and thyroid not  enlarged, symmetric, no tenderness/mass/nodules Back: symmetric, no curvature. ROM normal. No CVA tenderness. Lungs: clear to auscultation bilaterally Heart: regular rate and rhythm, S1, S2 normal, no murmur, click, rub or gallop Abdomen: soft, non-tender; bowel sounds normal; no masses,  no organomegaly Pulses: 2+ and symmetric Skin: Skin color, texture, turgor normal. No rashes or lesions Lymph nodes: Cervical, supraclavicular, and axillary nodes normal.  Lab Results  Component Value Date   HGBA1C 5.2 09/04/2017    Lab Results  Component Value Date   CREATININE 0.91 06/20/2018   CREATININE 0.70 05/26/2018   CREATININE 0.88 05/20/2018    Lab Results  Component Value Date   WBC 6.7 05/26/2018   HGB 15.1 05/26/2018   HCT 43.2 05/26/2018   PLT 268 05/26/2018   GLUCOSE 90 06/20/2018   CHOL 210 (H) 12/19/2016   TRIG 59.0 12/19/2016   HDL 101.10 12/19/2016   LDLCALC 97 12/19/2016   ALT 17 05/20/2018   AST 22 05/20/2018   NA 138 06/20/2018   K 4.0 06/20/2018   CL 104 06/20/2018   CREATININE 0.91 06/20/2018   BUN 21 06/20/2018   CO2 24 06/20/2018   TSH 2.20 05/20/2018   HGBA1C 5.2 09/04/2017    No results found.  Assessment & Plan:   Problem List Items Addressed This Visit    Alcohol abuse counseling and surveillance    She continues to minimize use but has several symptoms and signs, including weight gain despite restrictive eating habits,   Insomnia,  b12 deficiency and recurrent dehdration.      Anorexia nervosa,restricting type, in partial remission, severe    She continues to suffer psychologically and states that she is ready to be referred back to [facility] clinic      Relevant Orders   Ambulatory referral to Psychology   B12 deficiency     Lat dose Oct 10 .  She  Has not but will need to resume b12 injections weeky for 4 weeks.  She has self injecxted in the remote past.  rx sent to local pharmacy and dose given today .   Lab Results    Component Value Date   VITAMINB12 182 (L) 05/20/2018         Relevant Medications   cyanocobalamin ((VITAMIN B-12)) injection 1,000 mcg (Completed)   Other Relevant Orders   Intrinsic Factor Antibodies   Alcohol, methyl (methanol), blood   Magnesium (Completed)   RBC Folate   Hypertension    Not at goal.  Given fatigue,  Will stop metoprolol and start losartan.  Continue amlodipine        Relevant Medications   losartan (COZAAR) 50 MG tablet   Hypokalemia    Resolved,  Mag level is normal.  Lab Results  Component Value Date   NA 138 06/20/2018   K 4.0 06/20/2018   CL 104 06/20/2018   CO2 24 06/20/2018  Other Visit Diagnoses    Hyponatremia    -  Primary   Relevant Orders   Basic metabolic panel (Completed)      I have discontinued Miesha M. Bench's Magnesium Oxide, potassium chloride SA, and promethazine. I am also having her start on cyanocobalamin and SYRINGE 3CC/25GX1". Additionally, I am having her maintain her Capsaicin-Menthol-Methyl Sal, amLODipine, ALPRAZolam, buPROPion, metoprolol succinate, levothyroxine, and losartan. We administered cyanocobalamin.  Meds ordered this encounter  Medications  . losartan (COZAAR) 50 MG tablet    Sig: Take 1 tablet (50 mg total) by mouth daily.    Dispense:  90 tablet    Refill:  1  . cyanocobalamin (,VITAMIN B-12,) 1000 MCG/ML injection    Sig: Inject 1 mL (1,000 mcg total) into the muscle once a week. for 4 weeks,  then monthly    Dispense:  10 mL    Refill:  0  . Syringe/Needle, Disp, (SYRINGE 3CC/25GX1") 25G X 1" 3 ML MISC    Sig: Use for b12 injections    Dispense:  50 each    Refill:  0  . cyanocobalamin ((VITAMIN B-12)) injection 1,000 mcg   A total of 40 minutes was spent with patient more than half of which was spent in counseling patient on the above mentioned issues , reviewing and explaining recent labs and imaging studies done, and coordination of care.  Medications Discontinued During This  Encounter  Medication Reason  . Magnesium Oxide 400 (240 Mg) MG TABS Error  . potassium chloride SA (K-DUR,KLOR-CON) 20 MEQ tablet Error  . promethazine (PHENERGAN) 12.5 MG tablet Error  . losartan (COZAAR) 50 MG tablet Reorder    Follow-up: Return in about 4 weeks (around 07/18/2018).   Sherlene Shams, MD

## 2018-06-21 LAB — BASIC METABOLIC PANEL
BUN: 21 mg/dL (ref 7–25)
CHLORIDE: 104 mmol/L (ref 98–110)
CO2: 24 mmol/L (ref 20–32)
CREATININE: 0.91 mg/dL (ref 0.50–1.05)
Calcium: 9.7 mg/dL (ref 8.6–10.4)
Glucose, Bld: 90 mg/dL (ref 65–99)
POTASSIUM: 4 mmol/L (ref 3.5–5.3)
Sodium: 138 mmol/L (ref 135–146)

## 2018-06-22 DIAGNOSIS — Z7141 Alcohol abuse counseling and surveillance of alcoholic: Secondary | ICD-10-CM | POA: Insufficient documentation

## 2018-06-22 LAB — FOLATE RBC: RBC Folate: 590 ng/mL RBC (ref >280)

## 2018-06-22 NOTE — Assessment & Plan Note (Signed)
Not at goal.  Given fatigue,  Will stop metoprolol and start losartan.  Continue amlodipine

## 2018-06-22 NOTE — Assessment & Plan Note (Signed)
She continues to minimize use but has several symptoms and signs, including weight gain despite restrictive eating habits,   Insomnia,  b12 deficiency and recurrent dehdration.

## 2018-06-22 NOTE — Assessment & Plan Note (Addendum)
Resolved,  Mag level is normal.  Lab Results  Component Value Date   NA 138 06/20/2018   K 4.0 06/20/2018   CL 104 06/20/2018   CO2 24 06/20/2018

## 2018-06-22 NOTE — Assessment & Plan Note (Signed)
She continues to suffer psychologically and states that she is ready to be referred back to [facility] Eating Disorders clinic

## 2018-06-22 NOTE — Assessment & Plan Note (Signed)
Lat dose Oct 10 .  She  Has not but will need to resume b12 injections weeky for 4 weeks.  She has self injecxted in the remote past.  rx sent to local pharmacy and dose given today .   Lab Results  Component Value Date   VITAMINB12 182 (L) 05/20/2018

## 2018-06-23 LAB — ALCOHOL, METHYL (METHANOL), BLOOD: METHANOL LVL: NOT DETECTED mg/dL

## 2018-06-24 ENCOUNTER — Ambulatory Visit (INDEPENDENT_AMBULATORY_CARE_PROVIDER_SITE_OTHER): Payer: BLUE CROSS/BLUE SHIELD | Admitting: Psychology

## 2018-06-24 DIAGNOSIS — F320 Major depressive disorder, single episode, mild: Secondary | ICD-10-CM | POA: Diagnosis not present

## 2018-06-24 DIAGNOSIS — F418 Other specified anxiety disorders: Secondary | ICD-10-CM

## 2018-06-24 DIAGNOSIS — F5081 Binge eating disorder: Secondary | ICD-10-CM | POA: Diagnosis not present

## 2018-06-26 LAB — MAGNESIUM: MAGNESIUM: 2 mg/dL (ref 1.5–2.5)

## 2018-06-26 LAB — INTRINSIC FACTOR ANTIBODIES: Intrinsic Factor: NEGATIVE

## 2018-07-11 ENCOUNTER — Other Ambulatory Visit: Payer: Self-pay | Admitting: Internal Medicine

## 2018-08-01 DIAGNOSIS — F509 Eating disorder, unspecified: Secondary | ICD-10-CM | POA: Diagnosis not present

## 2018-08-11 ENCOUNTER — Other Ambulatory Visit: Payer: Self-pay | Admitting: Internal Medicine

## 2018-08-18 ENCOUNTER — Other Ambulatory Visit: Payer: Self-pay | Admitting: Internal Medicine

## 2018-08-18 DIAGNOSIS — Z1231 Encounter for screening mammogram for malignant neoplasm of breast: Secondary | ICD-10-CM

## 2018-09-09 ENCOUNTER — Ambulatory Visit
Admission: RE | Admit: 2018-09-09 | Discharge: 2018-09-09 | Disposition: A | Discharge status: Home / Self Care | Payer: BLUE CROSS/BLUE SHIELD | Source: Ambulatory Visit | Attending: Internal Medicine | Admitting: Internal Medicine

## 2018-09-09 DIAGNOSIS — Z1231 Encounter for screening mammogram for malignant neoplasm of breast: Secondary | ICD-10-CM | POA: Insufficient documentation

## 2018-09-24 ENCOUNTER — Other Ambulatory Visit: Payer: Self-pay

## 2018-09-24 ENCOUNTER — Emergency Department
Admission: EM | Admit: 2018-09-24 | Discharge: 2018-09-24 | Disposition: A | Discharge status: Home / Self Care | Payer: BLUE CROSS/BLUE SHIELD | Attending: Emergency Medicine | Admitting: Emergency Medicine

## 2018-09-24 ENCOUNTER — Encounter: Payer: Self-pay | Admitting: Emergency Medicine

## 2018-09-24 DIAGNOSIS — K529 Noninfective gastroenteritis and colitis, unspecified: Secondary | ICD-10-CM | POA: Diagnosis not present

## 2018-09-24 DIAGNOSIS — E039 Hypothyroidism, unspecified: Secondary | ICD-10-CM | POA: Diagnosis not present

## 2018-09-24 DIAGNOSIS — I10 Essential (primary) hypertension: Secondary | ICD-10-CM | POA: Diagnosis not present

## 2018-09-24 DIAGNOSIS — Z87891 Personal history of nicotine dependence: Secondary | ICD-10-CM | POA: Diagnosis not present

## 2018-09-24 DIAGNOSIS — Z79899 Other long term (current) drug therapy: Secondary | ICD-10-CM | POA: Diagnosis not present

## 2018-09-24 DIAGNOSIS — R197 Diarrhea, unspecified: Secondary | ICD-10-CM | POA: Diagnosis not present

## 2018-09-24 LAB — BASIC METABOLIC PANEL
Anion gap: 8 (ref 5–15)
BUN: 18 mg/dL (ref 6–20)
CHLORIDE: 105 mmol/L (ref 98–111)
CO2: 25 mmol/L (ref 22–32)
Calcium: 9.7 mg/dL (ref 8.9–10.3)
Creatinine, Ser: 0.94 mg/dL (ref 0.44–1.00)
GFR calc Af Amer: >60 mL/min (ref >60)
GFR calc non Af Amer: >60 mL/min (ref >60)
Glucose, Bld: 107 mg/dL — ABNORMAL HIGH (ref 70–99)
POTASSIUM: 4 mmol/L (ref 3.5–5.1)
SODIUM: 138 mmol/L (ref 135–145)

## 2018-09-24 LAB — CBC
HEMATOCRIT: 49.7 % — AB (ref 36.0–46.0)
HEMOGLOBIN: 16.6 g/dL — AB (ref 12.0–15.0)
MCH: 32 pg (ref 26.0–34.0)
MCHC: 33.4 g/dL (ref 30.0–36.0)
MCV: 95.9 fL (ref 80.0–100.0)
Platelets: 305 10*3/uL (ref 150–400)
RBC: 5.18 MIL/uL — AB (ref 3.87–5.11)
RDW: 12.5 % (ref 11.5–15.5)
WBC: 6.5 10*3/uL (ref 4.0–10.5)
nRBC: 0 % (ref 0.0–0.2)

## 2018-09-24 LAB — URINALYSIS, COMPLETE (UACMP) WITH MICROSCOPIC
BACTERIA UA: NONE SEEN
BILIRUBIN URINE: NEGATIVE
GLUCOSE, UA: NEGATIVE mg/dL
HGB URINE DIPSTICK: NEGATIVE
Ketones, ur: NEGATIVE mg/dL
Leukocytes, UA: NEGATIVE
NITRITE: NEGATIVE
PROTEIN: NEGATIVE mg/dL
Specific Gravity, Urine: 1.016 (ref 1.005–1.030)
Squamous Epithelial / LPF: NONE SEEN (ref 0–5)
pH: 7 (ref 5.0–8.0)

## 2018-09-24 LAB — INFLUENZA PANEL BY PCR (TYPE A & B)
INFLBPCR: NEGATIVE
Influenza A By PCR: NEGATIVE

## 2018-09-24 MED ORDER — SODIUM CHLORIDE 0.9 % IV SOLN
1000.0000 mL | Freq: Once | INTRAVENOUS | Status: AC
  Administered 2018-09-24: 1000 mL via INTRAVENOUS

## 2018-09-24 MED ORDER — ONDANSETRON HCL 4 MG/2ML IJ SOLN
4.0000 mg | Freq: Once | INTRAMUSCULAR | Status: AC
  Administered 2018-09-24: 4 mg via INTRAVENOUS
  Filled 2018-09-24: qty 2

## 2018-09-24 MED ORDER — ONDANSETRON 4 MG PO TBDP: 4.0000 mg | ORAL_TABLET | Freq: Three times a day (TID) | ORAL | 0 refills | Status: DC | PRN

## 2018-09-24 MED ORDER — SODIUM CHLORIDE 0.9 % IV BOLUS
1000.0000 mL | Freq: Once | INTRAVENOUS | Status: AC
  Administered 2018-09-24: 1000 mL via INTRAVENOUS

## 2018-09-24 MED ORDER — KETOROLAC TROMETHAMINE 30 MG/ML IJ SOLN
30.0000 mg | Freq: Once | INTRAMUSCULAR | Status: AC
  Administered 2018-09-24: 30 mg via INTRAVENOUS
  Filled 2018-09-24: qty 1

## 2018-09-24 NOTE — ED Triage Notes (Addendum)
Pt c/o diarrhea with nausea and weakness xfew days. Also c/o headache. Pt states same symptoms in past from dehydration. VSS., pt appears fatigue.

## 2018-09-24 NOTE — ED Provider Notes (Signed)
[facility] Emergency Department Provider Note   ____________________________________________    I have reviewed the triage vital signs and the nursing notes.   HISTORY  Chief Complaint Diarrhea     HPI Allison Coffey is a 58 y.o. female who presents with complaints of nausea vomiting diarrhea over the last 4 days.  She reports diffuse body aches as well.  No recent travel.  Has not taken anything for this.  Has some difficulty tolerating p.o.'s.  Nonbilious nonbloody vomitus.  No sick contacts reported.  Positive chills unclear about fevers.  No abdominal pain.  Feels fatigued and dehydrated  Past Medical History:  Diagnosis Date  . Anorexia 1988  . Hyperlipidemia   . Hypertension   . Syncope y-4    Patient Active Problem List   Diagnosis Date Noted  . Alcohol abuse counseling and surveillance 06/22/2018  . Right facial numbness 05/26/2018  . B12 deficiency 05/22/2018  . Hypokalemia 05/22/2018  . GAD (generalized anxiety disorder) 04/15/2016  . 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 Hypertension 01/27/2016  . Visit for preventive health examination 12/10/2015  . S/P abdominal supracervical subtotal hysterectomy 12/07/2015  . Anorexia nervosa,restricting type, in partial remission, severe 01/23/2015  . Hypothyroidism 01/23/2015    Past Surgical History:  Procedure Laterality Date  . ABDOMINAL HYSTERECTOMY  2012   Uterus only.  . CHOLECYSTECTOMY  1989  . ROTATOR CUFF REPAIR Left 2012    Prior to Admission medications   Medication Sig Start Date End Date Taking? Authorizing Provider  ALPRAZolam Prudy Feeler) 0.5 MG tablet TAKE ONE TABLET BY MOUTH AT BEDTIME AS NEEDED FOR ANXIETY 05/20/18   Sherlene Shams, MD  amLODipine (NORVASC) 5 MG tablet TAKE 1 TABLET BY MOUTH EVERY DAY 08/11/18   Sherlene Shams, MD  buPROPion (WELLBUTRIN XL) 150 MG 24 hr tablet Take 1 tablet (150 mg total) by mouth daily. 05/20/18   Sherlene Shams, MD    Capsaicin-Menthol-Methyl Sal (CAPSAICIN-METHYL SAL-MENTHOL) 0.025-1-12 % CREA Apply 1 application topically 2 (two) times daily. 09/19/17   Kordsmeier, Amil Amen, FNP  cyanocobalamin (,VITAMIN B-12,) 1000 MCG/ML injection INJECT 1 ML (1,000 MCG TOTAL) INTO THE MUSCLE ONCE A WEEK. FOR 4 WEEKS, THEN MONTHLY 07/11/18   Sherlene Shams, MD  levothyroxine (SYNTHROID, LEVOTHROID) 88 MCG tablet TAKE 1 TABLET (88 MCG TOTAL) BY MOUTH DAILY BEFORE BREAKFAST. 06/05/18   Sherlene Shams, MD  losartan (COZAAR) 50 MG tablet Take 1 tablet (50 mg total) by mouth daily. 06/20/18   Sherlene Shams, MD  metoprolol succinate (TOPROL-XL) 25 MG 24 hr tablet Take 1 tablet (25 mg total) by mouth daily. 05/26/18   Sherlene Shams, MD  ondansetron (ZOFRAN ODT) 4 MG disintegrating tablet Take 1 tablet (4 mg total) by mouth every 8 (eight) hours as needed for nausea or vomiting. 09/24/18   Jene Every, MD  Syringe/Needle, Disp, (SYRINGE 3CC/25GX1") 25G X 1" 3 ML MISC Use for b12 injections 06/20/18   Sherlene Shams, MD     Allergies Patient has no known allergies.  Family History  Problem Relation Age of Onset  . Heart disease Mother 67  . Stroke Mother 49  . Stroke Maternal Grandmother   . Heart disease Maternal Grandmother   . Heart disease Paternal Grandmother   . Heart disease Paternal Grandfather   . Breast cancer Neg Hx     Social History Social History   Tobacco Use  . Smoking status: Former Smoker    Last  attempt to quit: 01/19/1995    Years since quitting: 23.6  . Smokeless tobacco: Never Used  Substance Use Topics  . Alcohol use: Yes    Alcohol/week: 0.0 standard drinks    Comment: occasionally  . Drug use: No    Review of Systems  Constitutional: As above Eyes: No visual changes.  ENT: No sore throat. Cardiovascular: Denies chest pain. Respiratory: Denies shortness of breath. Gastrointestinal: As above Genitourinary: Negative for dysuria. Musculoskeletal: Myalgias as above Skin: Negative  for rash. Neurological: Negative for headaches or weakness   ____________________________________________   PHYSICAL EXAM:  VITAL SIGNS: ED Triage Vitals [09/24/18 1014]  Enc Vitals Group     BP (!) 156/96     Pulse Rate (!) 105     Resp 16     Temp 98.5 F (36.9 C)     Temp Source Oral     SpO2 99 %     Weight      Height      Head Circumference      Peak Flow      Pain Score 9     Pain Loc      Pain Edu?      Excl. in GC?     Constitutional: Alert and oriented. No acute distress.  Nose: No congestion/rhinnorhea. Mouth/Throat: Mucous membranes are moist.    Cardiovascular: Normal rate, regular rhythm. Grossly normal heart sounds.  Good peripheral circulation. Respiratory: Normal respiratory effort.  No retractions. Lungs CTAB. Gastrointestinal: Soft and nontender. No distention.  No CVA tenderness.  Musculoskeletal: No lower extremity tenderness nor edema.  Warm and well perfused Neurologic:  Normal speech and language. No gross focal neurologic deficits are appreciated.  Skin:  Skin is warm, dry and intact. No rash noted. Psychiatric: Mood and affect are normal. Speech and behavior are normal.  ____________________________________________   LABS (all labs ordered are listed, but only abnormal results are displayed)  Labs Reviewed  BASIC METABOLIC PANEL - Abnormal; Notable for the following components:      Result Value   Glucose, Bld 107 (*)    All other components within normal limits  CBC - Abnormal; Notable for the following components:   RBC 5.18 (*)    Hemoglobin 16.6 (*)    HCT 49.7 (*)    All other components within normal limits  URINALYSIS, COMPLETE (UACMP) WITH MICROSCOPIC - Abnormal; Notable for the following components:   Color, Urine YELLOW (*)    APPearance CLOUDY (*)    Crystals PRESENT (*)    All other components within normal limits  INFLUENZA PANEL BY PCR (TYPE A & B)  CBG MONITORING, ED    ____________________________________________  EKG  ED ECG REPORT I, Jene Everyobert Bronte Sabado, the attending physician, personally viewed and interpreted this ECG.  Date: 09/24/2018  Rhythm: Sinus tachycardia QRS Axis: normal Intervals: normal ST/T Wave abnormalities: Nonspecific changes   ____________________________________________  RADIOLOGY  None ____________________________________________   PROCEDURES  Procedure(s) performed: No  Procedures   Critical Care performed: No ____________________________________________   INITIAL IMPRESSION / ASSESSMENT AND PLAN / ED COURSE  Pertinent labs & imaging results that were available during my care of the patient were reviewed by me and considered in my medical decision making (see chart for details).  Patient presents with symptoms consistent with viral gastroenteritis which is common in the community at this time.  Influenza is negative.  Lab work is overall reassuring, suspect mild dehydration.  Treated with IV fluids, IV Zofran, IV Toradol, will reevaluate   -----------------------------------------  1:40 PM on 09/24/2018 -----------------------------------------  Patient feeling better after treatment, appropriate for discharge at this point.  Recommend continue supportive care, return precautions discussed    ____________________________________________   FINAL CLINICAL IMPRESSION(S) / ED DIAGNOSES  Final diagnoses:  Gastroenteritis        Note:  This document was prepared using Dragon voice recognition software and may include unintentional dictation errors.   Jene Every, MD 09/24/18 1340

## 2018-09-24 NOTE — ED Notes (Signed)
C/o headache, body aches, nausea, and generalized weakness since Saturday. Chills but has not checked temperature. Given blanket. NAD.

## 2018-09-28 ENCOUNTER — Other Ambulatory Visit: Payer: Self-pay | Admitting: Internal Medicine

## 2018-10-14 ENCOUNTER — Encounter: Payer: Self-pay | Admitting: Emergency Medicine

## 2018-10-14 ENCOUNTER — Ambulatory Visit
Admission: EM | Admit: 2018-10-14 | Discharge: 2018-10-14 | Disposition: A | Discharge status: Home / Self Care | Payer: BLUE CROSS/BLUE SHIELD | Attending: Family Medicine | Admitting: Family Medicine

## 2018-10-14 ENCOUNTER — Other Ambulatory Visit: Payer: Self-pay

## 2018-10-14 DIAGNOSIS — R112 Nausea with vomiting, unspecified: Secondary | ICD-10-CM

## 2018-10-14 DIAGNOSIS — R197 Diarrhea, unspecified: Secondary | ICD-10-CM | POA: Diagnosis not present

## 2018-10-14 HISTORY — DX: Disorder of thyroid, unspecified: E07.9

## 2018-10-14 LAB — CBC WITH DIFFERENTIAL/PLATELET
Abs Immature Granulocytes: 0.02 10*3/uL (ref 0.00–0.07)
Basophils Absolute: 0 10*3/uL (ref 0.0–0.1)
Basophils Relative: 0 %
Eosinophils Absolute: 0.2 10*3/uL (ref 0.0–0.5)
Eosinophils Relative: 3 %
HCT: 48.3 % — ABNORMAL HIGH (ref 36.0–46.0)
Hemoglobin: 16.3 g/dL — ABNORMAL HIGH (ref 12.0–15.0)
Immature Granulocytes: 0 %
Lymphocytes Relative: 21 %
Lymphs Abs: 1.1 10*3/uL (ref 0.7–4.0)
MCH: 32.3 pg (ref 26.0–34.0)
MCHC: 33.7 g/dL (ref 30.0–36.0)
MCV: 95.8 fL (ref 80.0–100.0)
Monocytes Absolute: 0.4 10*3/uL (ref 0.1–1.0)
Monocytes Relative: 8 %
Neutro Abs: 3.7 10*3/uL (ref 1.7–7.7)
Neutrophils Relative %: 68 %
Platelets: 262 10*3/uL (ref 150–400)
RBC: 5.04 MIL/uL (ref 3.87–5.11)
RDW: 12.5 % (ref 11.5–15.5)
WBC: 5.4 10*3/uL (ref 4.0–10.5)
nRBC: 0 % (ref 0.0–0.2)

## 2018-10-14 LAB — COMPREHENSIVE METABOLIC PANEL
ALT: 37 U/L (ref 0–44)
AST: 52 U/L — ABNORMAL HIGH (ref 15–41)
Albumin: 4.3 g/dL (ref 3.5–5.0)
Alkaline Phosphatase: 80 U/L (ref 38–126)
Anion gap: 9 (ref 5–15)
BUN: 15 mg/dL (ref 6–20)
CO2: 24 mmol/L (ref 22–32)
CREATININE: 0.56 mg/dL (ref 0.44–1.00)
Calcium: 9.4 mg/dL (ref 8.9–10.3)
Chloride: 105 mmol/L (ref 98–111)
GFR calc non Af Amer: >60 mL/min (ref >60)
Glucose, Bld: 92 mg/dL (ref 70–99)
Potassium: 4.1 mmol/L (ref 3.5–5.1)
Sodium: 138 mmol/L (ref 135–145)
Total Bilirubin: 0.4 mg/dL (ref 0.3–1.2)
Total Protein: 7.7 g/dL (ref 6.5–8.1)

## 2018-10-14 LAB — RAPID INFLUENZA A&B ANTIGENS (ARMC ONLY): INFLUENZA B (ARMC): NEGATIVE

## 2018-10-14 LAB — RAPID INFLUENZA A&B ANTIGENS: Influenza A (ARMC): NEGATIVE

## 2018-10-14 MED ORDER — ONDANSETRON 8 MG PO TBDP
8.0000 mg | ORAL_TABLET | Freq: Once | ORAL | Status: AC
  Administered 2018-10-14: 8 mg via ORAL

## 2018-10-14 MED ORDER — ONDANSETRON 4 MG PO TBDP: 4.0000 mg | ORAL_TABLET | Freq: Three times a day (TID) | ORAL | 0 refills | Status: DC | PRN

## 2018-10-14 NOTE — Discharge Instructions (Signed)
Rest. Lots of fluids.  If you continue to not feel well and cannot keep anything down, go to the hospital.  Take care  Dr. Adriana Simas

## 2018-10-14 NOTE — ED Provider Notes (Signed)
[facility]    CSN: [id number] Arrival date & time: 10/14/18  1015     History   Chief Complaint Chief Complaint  Patient presents with  . Diarrhea  . Emesis   HPI  58 year old female with a past medical history of anorexia who is awaiting placement for inpatient treatment for anorexia/eating disorder presents with nausea, vomiting, diarrhea.  Patient recently seen for similar symptoms on 1/29.  Patient states that she improved but then worsened again for the past 3 days.  Patient reports nausea, vomiting, diarrhea.  Diarrhea is her predominant symptom.  Associated weakness, fatigue, abdominal cramping.  She reports body aches as well.  She is has a mild cough.  Symptoms are severe. She is currently afebrile.  Patient states that she often feels dizzy and lightheaded as if she is going to pass out.  Patient states that she intakes approximately 500 cal a day.  No other symptoms.  No other complaints at this time.  PMH, Surgical Hx, Family Hx, Social History reviewed and updated as below.  Past Medical History:  Diagnosis Date  . Anorexia 1988  . Hyperlipidemia   . Hypertension   . Syncope y-4  . Thyroid disease     Patient Active Problem List   Diagnosis Date Noted  . Alcohol abuse counseling and surveillance 06/22/2018  . Right facial numbness 05/26/2018  . B12 deficiency 05/22/2018  . Hypokalemia 05/22/2018  . GAD (generalized anxiety disorder) 04/15/2016  . 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 Hypertension 01/27/2016  . Visit for preventive health examination 12/10/2015  . S/P abdominal supracervical subtotal hysterectomy 12/07/2015  . Anorexia nervosa,restricting type, in partial remission, severe 01/23/2015  . Hypothyroidism 01/23/2015    Past Surgical History:  Procedure Laterality Date  . ABDOMINAL HYSTERECTOMY  2012   Uterus only.  . CHOLECYSTECTOMY  1989  . ROTATOR CUFF REPAIR Left 2012    OB History   No obstetric history on  file.      Home Medications    Prior to Admission medications   Medication Sig Start Date End Date Taking? Authorizing Provider  ALPRAZolam Prudy Feeler) 0.5 MG tablet TAKE ONE TABLET BY MOUTH AT BEDTIME AS NEEDED FOR ANXIETY 05/20/18  Yes Sherlene Shams, MD  amLODipine (NORVASC) 5 MG tablet TAKE 1 TABLET BY MOUTH EVERY DAY 08/11/18  Yes Sherlene Shams, MD  buPROPion (WELLBUTRIN XL) 150 MG 24 hr tablet Take 1 tablet (150 mg total) by mouth daily. 05/20/18  Yes Sherlene Shams, MD  cyanocobalamin (,VITAMIN B-12,) 1000 MCG/ML injection INJECT 1 ML (1,000 MCG TOTAL) INTO THE MUSCLE ONCE A WEEK. FOR 4 WEEKS, THEN MONTHLY 09/29/18  Yes Sherlene Shams, MD  levothyroxine (SYNTHROID, LEVOTHROID) 88 MCG tablet TAKE 1 TABLET (88 MCG TOTAL) BY MOUTH DAILY BEFORE BREAKFAST. 06/05/18  Yes Sherlene Shams, MD  losartan (COZAAR) 50 MG tablet Take 1 tablet (50 mg total) by mouth daily. 06/20/18  Yes Sherlene Shams, MD  Syringe/Needle, Disp, (SYRINGE 3CC/25GX1") 25G X 1" 3 ML MISC Use for b12 injections 06/20/18  Yes Sherlene Shams, MD  ondansetron (ZOFRAN-ODT) 4 MG disintegrating tablet Take 1 tablet (4 mg total) by mouth every 8 (eight) hours as needed for nausea or vomiting. 10/14/18   Tommie Sams, DO    Family History Family History  Problem Relation Age of Onset  . Heart disease Mother 67  . Stroke Mother 23  . Stroke Maternal Grandmother   . Heart disease Maternal Grandmother   .  Heart disease Paternal Grandmother   . Heart disease Paternal Grandfather   . Healthy Father   . Breast cancer Neg Hx     Social History Social History   Tobacco Use  . Smoking status: Former Smoker    Last attempt to quit: 01/19/1995    Years since quitting: 23.7  . Smokeless tobacco: Never Used  Substance Use Topics  . Alcohol use: Yes    Alcohol/week: 0.0 standard drinks    Comment: occasionally  . Drug use: No     Allergies   Patient has no known allergies.   Review of Systems Review of Systems   Constitutional: Positive for fatigue.  Gastrointestinal: Positive for diarrhea, nausea and vomiting.  Neurological: Positive for dizziness, weakness and light-headedness.   Physical Exam Triage Vital Signs ED Triage Vitals [10/14/18 1031]  Enc Vitals Group     BP (!) 137/94     Pulse Rate 85     Resp 16     Temp 98.2 F (36.8 C)     Temp Source Oral     SpO2 100 %     Weight 136 lb (61.7 kg)     Height 5\' 3"  (1.6 m)     Head Circumference      Peak Flow      Pain Score 9     Pain Loc      Pain Edu?      Excl. in GC?    Updated Vital Signs BP (!) 137/94 (BP Location: Left Arm)   Pulse 85   Temp 98.2 F (36.8 C) (Oral)   Resp 16   Ht 5\' 3"  (1.6 m)   Wt 61.7 kg   SpO2 100%   BMI 24.09 kg/m   Visual Acuity Right Eye Distance:   Left Eye Distance:   Bilateral Distance:    Right Eye Near:   Left Eye Near:    Bilateral Near:     Physical Exam Vitals signs and nursing note reviewed.  Constitutional:      General: She is not in acute distress. HENT:     Head: Normocephalic and atraumatic.     Mouth/Throat:     Mouth: Mucous membranes are moist.     Pharynx: Oropharynx is clear.  Eyes:     General:        Right eye: No discharge.        Left eye: No discharge.     Conjunctiva/sclera: Conjunctivae normal.  Cardiovascular:     Rate and Rhythm: Normal rate and regular rhythm.  Pulmonary:     Effort: Pulmonary effort is normal.     Breath sounds: Normal breath sounds.  Neurological:     Mental Status: She is alert.  Psychiatric:        Behavior: Behavior normal.     Comments: Depressed mood.    UC Treatments / Results  Labs (all labs ordered are listed, but only abnormal results are displayed) Labs Reviewed  CBC WITH DIFFERENTIAL/PLATELET - Abnormal; Notable for the following components:      Result Value   Hemoglobin 16.3 (*)    HCT 48.3 (*)    All other components within normal limits  COMPREHENSIVE METABOLIC PANEL - Abnormal; Notable for the  following components:   AST 52 (*)    All other components within normal limits  RAPID INFLUENZA A&B ANTIGENS ([facility] ONLY)    EKG None  Radiology No results found.  Procedures Procedures (including critical care time)  Medications  Ordered in UC Medications  ondansetron (ZOFRAN-ODT) disintegrating tablet 8 mg (8 mg Oral Given 10/14/18 1102)    Initial Impression / Assessment and Plan / UC Course  I have reviewed the triage vital signs and the nursing notes.  Pertinent labs & imaging results that were available during my care of the patient were reviewed by me and considered in my medical decision making (see chart for details).    58 year old female presents with nausea, vomiting, diarrhea.  Laboratory studies revealed hemoconcentration.  Otherwise unremarkable.  Patient tolerating fluids here.  Zofran as needed.  Push fluids.  If she fails improve or worsens, she should go to the emergency room.  Final Clinical Impressions(s) / UC Diagnoses   Final diagnoses:  Nausea vomiting and diarrhea     Discharge Instructions     Rest. Lots of fluids.  If you continue to not feel well and cannot keep anything down, go to the hospital.  Take care  Dr. Adriana Simas     ED Prescriptions    Medication Sig Dispense Auth. Provider   ondansetron (ZOFRAN-ODT) 4 MG disintegrating tablet Take 1 tablet (4 mg total) by mouth every 8 (eight) hours as needed for nausea or vomiting. 20 tablet Tommie Sams, DO     Controlled Substance Prescriptions [place] Controlled Substance Registry consulted? Not Applicable   Tommie Sams, DO 10/14/18 1212

## 2018-10-14 NOTE — ED Triage Notes (Addendum)
Patient in today c/o N/V/D, body aches, abdominal cramping and weakness x 3 days. Patient was seen at [facility] ED on 09/24/18 and diagnosed with Norovirus. Patient did get better, but symptoms returned 3 days ago.

## 2018-11-09 ENCOUNTER — Other Ambulatory Visit: Payer: Self-pay | Admitting: Internal Medicine

## 2018-11-16 ENCOUNTER — Other Ambulatory Visit: Payer: Self-pay | Admitting: Internal Medicine

## 2018-11-25 MED ORDER — ALPRAZOLAM 0.5 MG PO TABS: ORAL_TABLET | ORAL | 1 refills | Status: DC

## 2018-11-25 NOTE — Telephone Encounter (Signed)
Refilled: 05/20/2018 Last OV: 06/20/2018 Next OV: not scheduled

## 2018-12-11 ENCOUNTER — Other Ambulatory Visit: Payer: Self-pay | Admitting: Internal Medicine

## 2019-01-02 DIAGNOSIS — I10 Essential (primary) hypertension: Secondary | ICD-10-CM | POA: Diagnosis not present

## 2019-01-02 DIAGNOSIS — F509 Eating disorder, unspecified: Secondary | ICD-10-CM | POA: Diagnosis not present

## 2019-01-02 DIAGNOSIS — E538 Deficiency of other specified B group vitamins: Secondary | ICD-10-CM | POA: Diagnosis not present

## 2019-01-02 DIAGNOSIS — E079 Disorder of thyroid, unspecified: Secondary | ICD-10-CM | POA: Diagnosis not present

## 2019-02-04 ENCOUNTER — Other Ambulatory Visit: Payer: Self-pay | Admitting: Internal Medicine

## 2019-02-06 NOTE — Telephone Encounter (Signed)
Refilled: 11/25/2018 Last OV: 06/20/2018 Next OV: not scheduled

## 2019-02-07 ENCOUNTER — Telehealth: Payer: Self-pay | Admitting: Internal Medicine

## 2019-02-09 ENCOUNTER — Other Ambulatory Visit: Payer: Self-pay | Admitting: Internal Medicine

## 2019-05-14 ENCOUNTER — Other Ambulatory Visit: Payer: Self-pay | Admitting: Internal Medicine

## 2019-05-16 ENCOUNTER — Other Ambulatory Visit: Payer: Self-pay | Admitting: Internal Medicine

## 2019-05-18 ENCOUNTER — Other Ambulatory Visit: Payer: Self-pay | Admitting: Internal Medicine

## 2019-05-19 ENCOUNTER — Encounter: Payer: Self-pay | Admitting: Internal Medicine

## 2019-05-19 ENCOUNTER — Other Ambulatory Visit: Payer: Self-pay

## 2019-05-19 ENCOUNTER — Ambulatory Visit (INDEPENDENT_AMBULATORY_CARE_PROVIDER_SITE_OTHER): Payer: BC Managed Care – PPO | Admitting: Internal Medicine

## 2019-05-19 VITALS — Ht 63.0 in | Wt 136.0 lb

## 2019-05-19 DIAGNOSIS — E034 Atrophy of thyroid (acquired): Secondary | ICD-10-CM | POA: Diagnosis not present

## 2019-05-19 DIAGNOSIS — F419 Anxiety disorder, unspecified: Secondary | ICD-10-CM | POA: Diagnosis not present

## 2019-05-19 DIAGNOSIS — F5105 Insomnia due to other mental disorder: Secondary | ICD-10-CM

## 2019-05-19 DIAGNOSIS — I10 Essential (primary) hypertension: Secondary | ICD-10-CM | POA: Diagnosis not present

## 2019-05-19 DIAGNOSIS — F411 Generalized anxiety disorder: Secondary | ICD-10-CM

## 2019-05-19 MED ORDER — TEMAZEPAM 22.5 MG PO CAPS: 22.5000 mg | ORAL_CAPSULE | Freq: Every evening | ORAL | 0 refills | Status: DC | PRN

## 2019-05-19 MED ORDER — ALPRAZOLAM 0.5 MG PO TABS: ORAL_TABLET | ORAL | 0 refills | Status: DC

## 2019-05-19 NOTE — Assessment & Plan Note (Signed)
Urged to try restoril as an  Alternative to alprazolam.  The risks and benefits of benzodiazepine use were discussed with patient today including excessive sedation leading to respiratory depression,  impaired thinking/driving, and addiction.  Patient was advised to avoid concurrent use with alcohol, to use medication only as needed and not to share with others  .

## 2019-05-19 NOTE — Progress Notes (Signed)
VIRTUAL VISIT VIA [email or web address].ME  This visit type was conducted due to national recommendations for restrictions regarding the COVID-19 pandemic (e.g. social distancing).  This format is felt to be most appropriate for this patient at this time.  All issues noted in this document were discussed and addressed.  No physical exam was performed (except for noted visual exam findings with Video Visits).   I connected with@ on 05/19/19 at  4:00 PM EDT by a video enabled telemedicine application  and verified that I am speaking with the correct person using two identifiers. Location patient: home Location provider: work or home office Persons participating in the virtual visit: patient, provider  I discussed the limitations, risks, security and privacy concerns of performing an evaluation and management service by telephone and the availability of in person appointments. I also discussed with the patient that there may be a patient responsible charge related to this service. The patient expressed understanding and agreed to proceed.  Interactive audio and video telecommunications were attempted between this provider and patient, however failed, due to patient having technical difficulties OR patient did not have access to video capability.  We continued and completed visit with audio only.   Reason for visit: follow up on insomnia,  Eating disorder and hypertension  HPI:  58 yr old female with history of morbid obesity (remote)and subsequent  Eating disorder , hypertension and insomnia managed with alprazolam   The patient has no signs or symptoms of COVID 19 infection (fever, cough, sore throat  or shortness of breath beyond what is typical for patient).  Patient denies contact with other persons with the above mentioned symptoms or with anyone confirmed to have COVID 19 .   Increased financial stressors due to loss of employment.  Has been using alprazoalm every night to stop the worrying   Hypertension:  patient checks blood pressure twice weekly at home.  Readings have been for the most part > 140/80 at rest . Patient is following a reduce salt diet most days and is taking medications as prescribed   ROS: See pertinent positives and negatives per HPI.  Past Medical History:  Diagnosis Date  . Anorexia 1988  . Hyperlipidemia   . Hypertension   . Syncope y-4  . Thyroid disease     Past Surgical History:  Procedure Laterality Date  . ABDOMINAL HYSTERECTOMY  2012   Uterus only.  . CHOLECYSTECTOMY  1989  . ROTATOR CUFF REPAIR Left 2012    Family History  Problem Relation Age of Onset  . Heart disease Mother 76  . Stroke Mother 1  . Stroke Maternal Grandmother   . Heart disease Maternal Grandmother   . Heart disease Paternal Grandmother   . Heart disease Paternal Grandfather   . Healthy Father   . Breast cancer Neg Hx     SOCIAL HX:  reports that she quit smoking about 24 years ago. She has never used smokeless tobacco. She reports current alcohol use. She reports that she does not use drugs.   Current Outpatient Medications:  .  ALPRAZolam (XANAX) 0.5 MG tablet, 1 tablet  once daily as needed for anxiety as needed, Disp: 30 tablet, Rfl: 0 .  amLODipine (NORVASC) 5 MG tablet, TAKE 1 TABLET BY MOUTH EVERY DAY, Disp: 90 tablet, Rfl: 3 .  buPROPion (WELLBUTRIN XL) 150 MG 24 hr tablet, TAKE 1 TABLET BY MOUTH EVERY DAY, Disp: 30 tablet, Rfl: 0 .  levothyroxine (SYNTHROID) 88 MCG tablet, TAKE 1 TABLET (88 MCG TOTAL)  BY MOUTH DAILY BEFORE BREAKFAST., Disp: 90 tablet, Rfl: 1 .  losartan (COZAAR) 100 MG tablet, TAKE 1/2 TABLET BY MOUTH EVERY DAY, Disp: 45 tablet, Rfl: 1 .  ondansetron (ZOFRAN-ODT) 4 MG disintegrating tablet, Take 1 tablet (4 mg total) by mouth every 8 (eight) hours as needed for nausea or vomiting., Disp: 20 tablet, Rfl: 0 .  cyanocobalamin (,VITAMIN B-12,) 1000 MCG/ML injection, INJECT 1 ML (1,000 MCG TOTAL) INTO THE MUSCLE ONCE A WEEK. FOR 4 WEEKS, THEN MONTHLY  (Patient not taking: Reported on 05/19/2019), Disp: 4 mL, Rfl: 2 .  Syringe/Needle, Disp, (SYRINGE 3CC/25GX1") 25G X 1" 3 ML MISC, Use for b12 injections (Patient not taking: Reported on 05/19/2019), Disp: 50 each, Rfl: 0 .  temazepam (RESTORIL) 22.5 MG capsule, Take 1 capsule (22.5 mg total) by mouth at bedtime as needed for sleep., Disp: 30 capsule, Rfl: 0  EXAM:  VITALS per patient if applicable:  GENERAL: alert, oriented, appears well and in no acute distress  HEENT: atraumatic, conjunttiva clear, no obvious abnormalities on inspection of external nose and ears  NECK: normal movements of the head and neck  LUNGS: on inspection no signs of respiratory distress, breathing rate appears normal, no obvious gross SOB, gasping or wheezing  CV: no obvious cyanosis  MS: moves all visible extremities without noticeable abnormality  PSYCH/NEURO: pleasant and cooperative, no obvious depression or anxiety, speech and thought processing grossly intact  ASSESSMENT AND PLAN:  Discussed the following assessment and plan:  Hypothyroidism due to acquired atrophy of thyroid - Plan: TSH  Essential hypertension - Plan: Lipid panel, Comprehensive metabolic panel  Insomnia secondary to anxiety  GAD (generalized anxiety disorder)  Insomnia secondary to anxiety Urged to try restoril as an  Alternative to alprazolam.  The risks and benefits of benzodiazepine use were discussed with patient today including excessive sedation leading to respiratory depression,  impaired thinking/driving, and addiction.  Patient was advised to avoid concurrent use with alcohol, to use medication only as needed and not to share with others  .   GAD (generalized anxiety disorder) Refills on alprazolam given.  Wellbutrin.  A total of 25 minutes was spent with patient more than half of which was spent in counseling patient on the above mentioned issues , reviewing and explaining recent labs , and coordination of care.    Hypertension Well controlled on current regimen. Renal function stable, no changes today.    I discussed the assessment and treatment plan with the patient. The patient was provided an opportunity to ask questions and all were answered. The patient agreed with the plan and demonstrated an understanding of the instructions.   The patient was advised to call back or seek an in-person evaluation if the symptoms worsen or if the condition fails to improve as anticipated.  I provided  25 minutes of non-face-to-face time during this encounter reviewing patient's current problems and post surgeries.  Providing counseling on the above mentioned problems , and coordination  of care .   Crecencio Mc, MD

## 2019-05-19 NOTE — Assessment & Plan Note (Signed)
Refills on alprazolam given.  Wellbutrin.  A total of 25 minutes was spent with patient more than half of which was spent in counseling patient on the above mentioned issues , reviewing and explaining recent labs , and coordination of care.

## 2019-05-19 NOTE — Assessment & Plan Note (Signed)
Well controlled on current regimen. Renal function stable, no changes today. 

## 2019-05-19 NOTE — Progress Notes (Signed)
Refills   Alprazolam   Levothyroxine

## 2019-05-31 ENCOUNTER — Ambulatory Visit (INDEPENDENT_AMBULATORY_CARE_PROVIDER_SITE_OTHER): Payer: BC Managed Care – PPO

## 2019-05-31 ENCOUNTER — Ambulatory Visit
Admission: EM | Admit: 2019-05-31 | Discharge: 2019-05-31 | Disposition: A | Discharge status: Home / Self Care | Payer: BC Managed Care – PPO | Attending: Family | Admitting: Family

## 2019-05-31 ENCOUNTER — Other Ambulatory Visit: Payer: Self-pay

## 2019-05-31 ENCOUNTER — Encounter: Payer: Self-pay | Admitting: Emergency Medicine

## 2019-05-31 DIAGNOSIS — M1712 Unilateral primary osteoarthritis, left knee: Secondary | ICD-10-CM | POA: Diagnosis not present

## 2019-05-31 DIAGNOSIS — M25562 Pain in left knee: Secondary | ICD-10-CM | POA: Diagnosis not present

## 2019-05-31 MED ORDER — KETOROLAC TROMETHAMINE 60 MG/2ML IM SOLN
60.0000 mg | Freq: Once | INTRAMUSCULAR | Status: AC
  Administered 2019-05-31: 60 mg via INTRAMUSCULAR

## 2019-05-31 MED ORDER — PREDNISONE 10 MG (21) PO TBPK: ORAL_TABLET | Freq: Every day | ORAL | 0 refills | Status: DC

## 2019-05-31 NOTE — Discharge Instructions (Addendum)
We gave you a shot of Toradol (anti-inflammatory) to help with pain and swelling today. Recommend start Prednisone 10mg  tablets - take 6 tablets today and tomorrow and then decrease by 1 tablet every 2 days until finished on day 12. Continue to use ice and alternate with heat as needed for comfort. No exercising- rest. May wear knee brace that you have at home for comfort. Recommend call your Orthopedic tomorrow to schedule appointment for further evaluation.

## 2019-05-31 NOTE — ED Provider Notes (Signed)
[facility]    CSN: [id number] Arrival date & time: 05/31/19  0908      History   Chief Complaint Chief Complaint  Patient presents with  . Knee Pain    left    HPI Allison Coffey is a 58 y.o. female.   58 year old female presents with left knee pain after using her tread climber for over an hour 1 week ago. Not usual for her to use her tread climber for 60 minutes and has exercised without pain or injury in the past. Later that day, her left knee started hurting. She continued to exercise throughout the week but not performing any leg exercises. Pain has continued to get worse with now more swelling around her patella. Pain is throbbing and even making her nauseous. Minimal radiation of pain down her left leg. No numbness or tingling. Has taken Ibuprofen and Tylenol along with using ice and heat with no relief. Has tried elevating her leg with no relief. No previous injury to left knee. Other chronic health issues include HTN, thyroid disease, and mood/sleep disorder and currently on Norvasc, Losartan, Synthroid, Wellbutrin, Restoril daily and Xanax prn.   The history is provided by the patient.    Past Medical History:  Diagnosis Date  . Anorexia 1988  . Hyperlipidemia   . Hypertension   . Syncope y-4  . Thyroid disease     Patient Active Problem List   Diagnosis Date Noted  . Alcohol abuse counseling and surveillance 06/22/2018  . Right facial numbness 05/26/2018  . B12 deficiency 05/22/2018  . Hypokalemia 05/22/2018  . GAD (generalized anxiety disorder) 04/15/2016  . 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 Hypertension 01/27/2016  . Visit for preventive health examination 12/10/2015  . S/P abdominal supracervical subtotal hysterectomy 12/07/2015  . Anorexia nervosa,restricting type, in partial remission, severe 01/23/2015  . Hypothyroidism 01/23/2015    Past Surgical History:  Procedure Laterality Date  . ABDOMINAL HYSTERECTOMY  2012   Uterus  only.  . CHOLECYSTECTOMY  1989  . ROTATOR CUFF REPAIR Left 2012    OB History   No obstetric history on file.      Home Medications    Prior to Admission medications   Medication Sig Start Date End Date Taking? Authorizing Provider  ALPRAZolam Prudy Feeler(XANAX) 0.5 MG tablet 1 tablet  once daily as needed for anxiety as needed 05/19/19  Yes Sherlene Shamsullo, Teresa L, MD  amLODipine (NORVASC) 5 MG tablet TAKE 1 TABLET BY MOUTH EVERY DAY 05/19/19  Yes Sherlene Shamsullo, Teresa L, MD  buPROPion (WELLBUTRIN XL) 150 MG 24 hr tablet TAKE 1 TABLET BY MOUTH EVERY DAY 05/14/19  Yes Sherlene Shamsullo, Teresa L, MD  levothyroxine (SYNTHROID) 88 MCG tablet TAKE 1 TABLET (88 MCG TOTAL) BY MOUTH DAILY BEFORE BREAKFAST. 05/19/19  Yes Sherlene Shamsullo, Teresa L, MD  losartan (COZAAR) 100 MG tablet TAKE 1/2 TABLET BY MOUTH EVERY DAY 05/19/19  Yes Sherlene Shamsullo, Teresa L, MD  temazepam (RESTORIL) 22.5 MG capsule Take 1 capsule (22.5 mg total) by mouth at bedtime as needed for sleep. 05/19/19  Yes Sherlene Shamsullo, Teresa L, MD  ondansetron (ZOFRAN-ODT) 4 MG disintegrating tablet Take 1 tablet (4 mg total) by mouth every 8 (eight) hours as needed for nausea or vomiting. 10/14/18   Tommie Samsook, Jayce G, DO  predniSONE (STERAPRED UNI-PAK 21 TAB) 10 MG (21) TBPK tablet Take by mouth daily. Take 6 tabs by mouth 1st 2 days then decrease by 1 tablet every 2 days until finished on day 12. 05/31/19  Sudie Grumbling, NP    Family History Family History  Problem Relation Age of Onset  . Heart disease Mother 34  . Stroke Mother 80  . Stroke Maternal Grandmother   . Heart disease Maternal Grandmother   . Heart disease Paternal Grandmother   . Heart disease Paternal Grandfather   . Healthy Father   . Breast cancer Neg Hx     Social History Social History   Tobacco Use  . Smoking status: Former Smoker    Quit date: 01/19/1995    Years since quitting: 24.3  . Smokeless tobacco: Never Used  Substance Use Topics  . Alcohol use: Yes    Alcohol/week: 0.0 standard drinks    Comment:  occasionally  . Drug use: No     Allergies   Patient has no known allergies.   Review of Systems Review of Systems  Constitutional: Negative for activity change, appetite change, chills, fatigue and fever.  Respiratory: Negative for chest tightness, shortness of breath and wheezing.   Cardiovascular: Negative for chest pain.  Gastrointestinal: Positive for nausea. Negative for diarrhea and vomiting.  Musculoskeletal: Positive for arthralgias, gait problem (limping due to knee pain), joint swelling and myalgias. Negative for back pain, neck pain and neck stiffness.  Skin: Negative for color change, rash and wound.  Allergic/Immunologic: Negative for food allergies and immunocompromised state.  Neurological: Negative for dizziness, tremors, seizures, weakness, light-headedness, numbness and headaches.  Hematological: Negative for adenopathy. Does not bruise/bleed easily.  Psychiatric/Behavioral: Positive for sleep disturbance.     Physical Exam Triage Vital Signs ED Triage Vitals  Enc Vitals Group     BP 05/31/19 0920 134/89     Pulse Rate 05/31/19 0920 78     Resp 05/31/19 0920 14     Temp 05/31/19 0920 98.1 F (36.7 C)     Temp Source 05/31/19 0920 Oral     SpO2 05/31/19 0920 100 %     Weight 05/31/19 0918 146 lb (66.2 kg)     Height 05/31/19 0918 5\' 3"  (1.6 m)     Head Circumference --      Peak Flow --      Pain Score 05/31/19 0918 9     Pain Loc --      Pain Edu? --      Excl. in GC? --    No data found.  Updated Vital Signs BP 134/89 (BP Location: Left Arm)   Pulse 78   Temp 98.1 F (36.7 C) (Oral)   Resp 14   Ht 5\' 3"  (1.6 m)   Wt 146 lb (66.2 kg)   SpO2 100%   BMI 25.86 kg/m   Visual Acuity Right Eye Distance:   Left Eye Distance:   Bilateral Distance:    Right Eye Near:   Left Eye Near:    Bilateral Near:     Physical Exam Vitals signs and nursing note reviewed.  Constitutional:      General: She is awake. She is not in acute distress.     Appearance: She is well-developed and well-groomed. She is not ill-appearing.     Comments: Patient sitting on exam table in no acute distress but appears in pain.   HENT:     Head: Normocephalic and atraumatic.     Right Ear: External ear normal.     Left Ear: External ear normal.  Eyes:     Extraocular Movements: Extraocular movements intact.     Conjunctiva/sclera: Conjunctivae normal.  Neck:  Musculoskeletal: Normal range of motion.  Cardiovascular:     Rate and Rhythm: Normal rate.  Pulmonary:     Effort: Pulmonary effort is normal.  Musculoskeletal:        General: Swelling and tenderness present.     Left knee: She exhibits decreased range of motion and swelling. She exhibits no ecchymosis, no laceration and no erythema. Tenderness found. Medial joint line, lateral joint line and patellar tendon tenderness noted.     Right lower leg: No edema.     Left lower leg: No edema.       Legs:     Comments: Decreased range of motion of left knee- especially with flexion and full extension. Very tender around entire patella, especially more anterior aspects. No redness or warmth. Swelling present around entire patella. No rash. Good distal pulses. No neuro deficits noted.   Skin:    General: Skin is warm and dry.     Capillary Refill: Capillary refill takes less than 2 seconds.     Findings: No rash.  Neurological:     General: No focal deficit present.     Mental Status: She is alert and oriented to person, place, and time.     Sensory: Sensation is intact.     Motor: Motor function is intact.  Psychiatric:        Mood and Affect: Mood normal.        Behavior: Behavior normal. Behavior is cooperative.        Thought Content: Thought content normal.        Judgment: Judgment normal.      UC Treatments / Results  Labs (all labs ordered are listed, but only abnormal results are displayed) Labs Reviewed - No data to display  EKG   Radiology Dg Knee Complete 4 Views Left   Result Date: 05/31/2019 CLINICAL DATA:  Left knee pain for 4 weeks EXAM: LEFT KNEE - COMPLETE 4+ VIEW COMPARISON:  None. FINDINGS: No fracture or dislocation. Generalized osteopenia. Severe patellofemoral compartment osteoarthritis. Mild medial and lateral femorotibial compartment osteoarthritis. Tricompartmental marginal osteophytes. No significant joint effusion. No aggressive osseous lesion. Soft tissues are normal. IMPRESSION: 1.  No acute osseous injury of the left knee. Electronically Signed   By: Kathreen Devoid   On: 05/31/2019 10:49    Procedures Procedures (including critical care time)  Medications Ordered in UC Medications  ketorolac (TORADOL) injection 60 mg (60 mg Intramuscular Given 05/31/19 1018)    Initial Impression / Assessment and Plan / UC Course  I have reviewed the triage vital signs and the nursing notes.  Pertinent labs & imaging results that were available during my care of the patient were reviewed by me and considered in my medical decision making (see chart for details).    Gave Toradol 60mg  IM now to help with pain and inflammation.  Reviewed x-ray results with patient- Multiple areas of arthritis present with some bone spurs. Recommend start Prednisone 10mg  12 day dose pack as directed. Continue to use ice and alternate with heat for comfort. No exercising. May wear knee brace that she has at home for support. Patient has seen an Orthopedic before and will call her Orthopedic ([facility]) tomorrow AM to schedule appointment for follow-up.  Final Clinical Impressions(s) / UC Diagnoses   Final diagnoses:  Acute pain of left knee  Osteoarthritis of left knee, unspecified osteoarthritis type     Discharge Instructions     We gave you a shot of Toradol (anti-inflammatory) to  help with pain and swelling today. Recommend start Prednisone  tablets - take 6 tablets today and tomorrow and then decrease by 1 tablet every 2 days until finished on day 12.  Continue to use ice and alternate with heat as needed for comfort. No exercising- rest. May wear knee brace that you have at home for comfort. Recommend call your Orthopedic tomorrow to schedule appointment for further evaluation.     ED Prescriptions    Medication Sig Dispense Auth. Provider   predniSONE (STERAPRED UNI-PAK 21 TAB) 10 MG (21) TBPK tablet Take by mouth daily. Take 6 tabs by mouth 1st 2 days then decrease by 1 tablet every 2 days until finished on day 12. 42 tablet Felicha Frayne, Ali Lowe, NP     PDMP not reviewed this encounter.   Sudie Grumbling, NP 06/01/19 1002

## 2019-05-31 NOTE — ED Triage Notes (Signed)
Patient states that she on her exercise equipment and started having left knee pain since last Sunday and has gotten worse over the past 2 days.  Patient denies fall.

## 2019-06-03 ENCOUNTER — Telehealth: Payer: Self-pay | Admitting: Internal Medicine

## 2019-06-03 NOTE — Telephone Encounter (Signed)
Lm to call office to make a fasting lab and flu shot appointment.

## 2019-06-08 ENCOUNTER — Other Ambulatory Visit: Payer: BC Managed Care – PPO

## 2019-06-12 ENCOUNTER — Other Ambulatory Visit: Payer: Self-pay | Admitting: Internal Medicine

## 2019-06-15 ENCOUNTER — Other Ambulatory Visit: Payer: Self-pay

## 2019-06-15 ENCOUNTER — Other Ambulatory Visit (INDEPENDENT_AMBULATORY_CARE_PROVIDER_SITE_OTHER): Payer: BC Managed Care – PPO

## 2019-06-15 DIAGNOSIS — Z23 Encounter for immunization: Secondary | ICD-10-CM | POA: Diagnosis not present

## 2019-06-15 DIAGNOSIS — I10 Essential (primary) hypertension: Secondary | ICD-10-CM

## 2019-06-15 DIAGNOSIS — E034 Atrophy of thyroid (acquired): Secondary | ICD-10-CM | POA: Diagnosis not present

## 2019-06-15 LAB — TSH: TSH: 0.74 u[IU]/mL (ref 0.35–4.50)

## 2019-06-15 LAB — COMPREHENSIVE METABOLIC PANEL
ALT: 17 U/L (ref 0–35)
AST: 20 U/L (ref 0–37)
Albumin: 4.3 g/dL (ref 3.5–5.2)
Alkaline Phosphatase: 86 U/L (ref 39–117)
BUN: 13 mg/dL (ref 6–23)
CO2: 27 mEq/L (ref 19–32)
Calcium: 9.4 mg/dL (ref 8.4–10.5)
Chloride: 103 mEq/L (ref 96–112)
Creatinine, Ser: 0.72 mg/dL (ref 0.40–1.20)
GFR: 83.07 mL/min (ref >60.00)
Glucose, Bld: 86 mg/dL (ref 70–99)
Potassium: 3.7 mEq/L (ref 3.5–5.1)
Sodium: 139 mEq/L (ref 135–145)
Total Bilirubin: 0.7 mg/dL (ref 0.2–1.2)
Total Protein: 6.6 g/dL (ref 6.0–8.3)

## 2019-06-15 LAB — LIPID PANEL
Cholesterol: 241 mg/dL — ABNORMAL HIGH (ref 0–200)
HDL: 102.4 mg/dL (ref >39.00)
LDL Cholesterol: 124 mg/dL — ABNORMAL HIGH (ref 0–99)
NonHDL: 138.85
Total CHOL/HDL Ratio: 2
Triglycerides: 76 mg/dL (ref 0.0–149.0)
VLDL: 15.2 mg/dL (ref 0.0–40.0)

## 2019-07-02 ENCOUNTER — Other Ambulatory Visit: Payer: Self-pay | Admitting: Internal Medicine

## 2019-07-28 DIAGNOSIS — U071 COVID-19: Secondary | ICD-10-CM

## 2019-07-28 HISTORY — DX: COVID-19: U07.1

## 2019-09-09 ENCOUNTER — Other Ambulatory Visit: Payer: Self-pay | Admitting: Internal Medicine

## 2019-09-09 NOTE — Telephone Encounter (Signed)
Refilled: 05/19/2019 Last OV: 05/19/2019 Next OV: not scheduled

## 2019-09-15 ENCOUNTER — Other Ambulatory Visit: Payer: Self-pay | Admitting: Internal Medicine

## 2019-11-13 ENCOUNTER — Other Ambulatory Visit: Payer: Self-pay | Admitting: Internal Medicine

## 2019-11-30 ENCOUNTER — Ambulatory Visit: Payer: BC Managed Care – PPO

## 2019-12-11 ENCOUNTER — Other Ambulatory Visit: Payer: Self-pay | Admitting: Internal Medicine

## 2020-03-02 ENCOUNTER — Other Ambulatory Visit: Payer: Self-pay | Admitting: Internal Medicine

## 2020-03-03 NOTE — Telephone Encounter (Signed)
Refill request for temazepam, last seen 05-19-19, last filled 07-07-19.  Please advise.

## 2020-03-22 ENCOUNTER — Other Ambulatory Visit: Payer: Self-pay | Admitting: Internal Medicine

## 2020-03-22 NOTE — Telephone Encounter (Signed)
Refill request for xanax, last seen 05-19-19, last filled 09-10-19.  Please advise.

## 2020-03-23 NOTE — Telephone Encounter (Signed)
Please notify patient that the prescription was denied because alprazolam is a  controlled substances and require a  6 month follow up and  it has been 10 months since her last visit. Marland Kitchen  OFFICE VISIT NEEDED prior to any more refills

## 2020-03-28 ENCOUNTER — Other Ambulatory Visit: Payer: Self-pay

## 2020-03-28 ENCOUNTER — Encounter: Payer: Self-pay | Admitting: Emergency Medicine

## 2020-03-28 ENCOUNTER — Ambulatory Visit
Admission: EM | Admit: 2020-03-28 | Discharge: 2020-03-28 | Disposition: A | Discharge status: Home / Self Care | Payer: BC Managed Care – PPO | Attending: Family Medicine | Admitting: Family Medicine

## 2020-03-28 DIAGNOSIS — R5383 Other fatigue: Secondary | ICD-10-CM | POA: Insufficient documentation

## 2020-03-28 DIAGNOSIS — E039 Hypothyroidism, unspecified: Secondary | ICD-10-CM | POA: Diagnosis not present

## 2020-03-28 DIAGNOSIS — F419 Anxiety disorder, unspecified: Secondary | ICD-10-CM | POA: Diagnosis not present

## 2020-03-28 DIAGNOSIS — R079 Chest pain, unspecified: Secondary | ICD-10-CM | POA: Insufficient documentation

## 2020-03-28 DIAGNOSIS — E785 Hyperlipidemia, unspecified: Secondary | ICD-10-CM | POA: Insufficient documentation

## 2020-03-28 DIAGNOSIS — Z87891 Personal history of nicotine dependence: Secondary | ICD-10-CM | POA: Insufficient documentation

## 2020-03-28 DIAGNOSIS — Z20822 Contact with and (suspected) exposure to covid-19: Secondary | ICD-10-CM | POA: Diagnosis not present

## 2020-03-28 DIAGNOSIS — Z79899 Other long term (current) drug therapy: Secondary | ICD-10-CM | POA: Diagnosis not present

## 2020-03-28 DIAGNOSIS — Z7901 Long term (current) use of anticoagulants: Secondary | ICD-10-CM | POA: Insufficient documentation

## 2020-03-28 DIAGNOSIS — I10 Essential (primary) hypertension: Secondary | ICD-10-CM | POA: Diagnosis not present

## 2020-03-28 DIAGNOSIS — J069 Acute upper respiratory infection, unspecified: Secondary | ICD-10-CM | POA: Insufficient documentation

## 2020-03-28 LAB — CBC WITH DIFFERENTIAL/PLATELET
Abs Immature Granulocytes: 0.02 10*3/uL (ref 0.00–0.07)
Basophils Absolute: 0.1 10*3/uL (ref 0.0–0.1)
Basophils Relative: 1 %
Eosinophils Absolute: 0.4 10*3/uL (ref 0.0–0.5)
Eosinophils Relative: 6 %
HCT: 41.8 % (ref 36.0–46.0)
Hemoglobin: 14 g/dL (ref 12.0–15.0)
Immature Granulocytes: 0 %
Lymphocytes Relative: 25 %
Lymphs Abs: 1.7 10*3/uL (ref 0.7–4.0)
MCH: 31.5 pg (ref 26.0–34.0)
MCHC: 33.5 g/dL (ref 30.0–36.0)
MCV: 93.9 fL (ref 80.0–100.0)
Monocytes Absolute: 0.5 10*3/uL (ref 0.1–1.0)
Monocytes Relative: 7 %
Neutro Abs: 4.2 10*3/uL (ref 1.7–7.7)
Neutrophils Relative %: 61 %
Platelets: 277 10*3/uL (ref 150–400)
RBC: 4.45 MIL/uL (ref 3.87–5.11)
RDW: 13.3 % (ref 11.5–15.5)
WBC: 6.9 10*3/uL (ref 4.0–10.5)
nRBC: 0 % (ref 0.0–0.2)

## 2020-03-28 LAB — COMPREHENSIVE METABOLIC PANEL
ALT: 19 U/L (ref 0–44)
AST: 20 U/L (ref 15–41)
Albumin: 3.9 g/dL (ref 3.5–5.0)
Alkaline Phosphatase: 65 U/L (ref 38–126)
Anion gap: 7 (ref 5–15)
BUN: 25 mg/dL — ABNORMAL HIGH (ref 6–20)
CO2: 26 mmol/L (ref 22–32)
Calcium: 8.9 mg/dL (ref 8.9–10.3)
Chloride: 103 mmol/L (ref 98–111)
Creatinine, Ser: 0.71 mg/dL (ref 0.44–1.00)
GFR calc Af Amer: >60 mL/min (ref >60)
GFR calc non Af Amer: >60 mL/min (ref >60)
Glucose, Bld: 111 mg/dL — ABNORMAL HIGH (ref 70–99)
Potassium: 3.9 mmol/L (ref 3.5–5.1)
Sodium: 136 mmol/L (ref 135–145)
Total Bilirubin: 0.7 mg/dL (ref 0.3–1.2)
Total Protein: 7.2 g/dL (ref 6.5–8.1)

## 2020-03-28 LAB — TSH: TSH: 1.073 u[IU]/mL (ref 0.350–4.500)

## 2020-03-28 NOTE — ED Provider Notes (Signed)
[facility]    CSN: [id number] Arrival date & time: 03/28/20  1346      History   Chief Complaint Chief Complaint  Patient presents with  . Fatigue  . Chest Pain  . Headache  . Cough    HPI Allison Coffey is a 59 y.o. female.   59 yo female with a c/o fatigue, dry cough, runny nose, nasal congestion, headache, body aches, chest pains. Denies shortness of breath, fevers, chills. No known sick contacts.    Chest Pain Associated symptoms: cough and headache   Headache Associated symptoms: cough   Cough Associated symptoms: chest pain and headaches     Past Medical History:  Diagnosis Date  . Anorexia 1988  . COVID-19 07/2019  . Hyperlipidemia   . Hypertension   . Syncope y-4  . Thyroid disease     Patient Active Problem List   Diagnosis Date Noted  . Alcohol abuse counseling and surveillance 06/22/2018  . Right facial numbness 05/26/2018  . B12 deficiency 05/22/2018  . Hypokalemia 05/22/2018  . GAD (generalized anxiety disorder) 04/15/2016  . 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 Hypertension 01/27/2016  . Visit for preventive health examination 12/10/2015  . S/P abdominal supracervical subtotal hysterectomy 12/07/2015  . Anorexia nervosa,restricting type, in partial remission, severe 01/23/2015  . Hypothyroidism 01/23/2015    Past Surgical History:  Procedure Laterality Date  . ABDOMINAL HYSTERECTOMY  2012   Uterus only.  . CHOLECYSTECTOMY  1989  . ROTATOR CUFF REPAIR Left 2012    OB History   No obstetric history on file.      Home Medications    Prior to Admission medications   Medication Sig Start Date End Date Taking? Authorizing Provider  ALPRAZolam Prudy Feeler) 0.5 MG tablet TAKE ONE TABLET BY MOUTH AT BEDTIME AS NEEDED FOR ANXIETY 09/10/19  Yes Sherlene Shams, MD  amLODipine (NORVASC) 5 MG tablet TAKE 1 TABLET BY MOUTH EVERY DAY 03/22/20  Yes Sherlene Shams, MD  levothyroxine (SYNTHROID) 88 MCG tablet TAKE 1 TABLET (88  MCG TOTAL) BY MOUTH DAILY BEFORE BREAKFAST. 03/03/20  Yes Sherlene Shams, MD  metoprolol succinate (TOPROL-XL) 25 MG 24 hr tablet TAKE 1 TABLET BY MOUTH EVERY DAY 07/07/19  Yes Sherlene Shams, MD  buPROPion (WELLBUTRIN XL) 150 MG 24 hr tablet TAKE 1 TABLET BY MOUTH EVERY DAY 06/15/19   Sherlene Shams, MD  losartan (COZAAR) 100 MG tablet TAKE 1/2 TABLET BY MOUTH EVERY DAY 09/15/19   Sherlene Shams, MD  ondansetron (ZOFRAN-ODT) 4 MG disintegrating tablet Take 1 tablet (4 mg total) by mouth every 8 (eight) hours as needed for nausea or vomiting. 10/14/18   Tommie Sams, DO  predniSONE (STERAPRED UNI-PAK 21 TAB) 10 MG (21) TBPK tablet Take by mouth daily. Take 6 tabs by mouth 1st 2 days then decrease by 1 tablet every 2 days until finished on day 12. 05/31/19   Sudie Grumbling, NP  temazepam (RESTORIL) 22.5 MG capsule TAKE 1 CAPSULE (22.5 MG TOTAL) BY MOUTH AT BEDTIME AS NEEDED FOR SLEEP. 07/07/19   Sherlene Shams, MD    Family History Family History  Problem Relation Age of Onset  . Heart disease Mother 56  . Stroke Mother 28  . Stroke Maternal Grandmother   . Heart disease Maternal Grandmother   . Heart disease Paternal Grandmother   . Heart disease Paternal Grandfather   . Healthy Father   . Breast cancer Neg Hx  Social History Social History   Tobacco Use  . Smoking status: Former Smoker    Quit date: 01/19/1995    Years since quitting: 25.2  . Smokeless tobacco: Never Used  Vaping Use  . Vaping Use: Never used  Substance Use Topics  . Alcohol use: Yes    Alcohol/week: 0.0 standard drinks    Comment: occasionally  . Drug use: No     Allergies   Patient has no known allergies.   Review of Systems Review of Systems  Respiratory: Positive for cough.   Cardiovascular: Positive for chest pain.  Neurological: Positive for headaches.     Physical Exam Triage Vital Signs ED Triage Vitals  Enc Vitals Group     BP 03/28/20 1414 (!) 130/92     Pulse Rate 03/28/20  1414 63     Resp 03/28/20 1414 18     Temp 03/28/20 1414 98.5 F (36.9 C)     Temp Source 03/28/20 1414 Oral     SpO2 03/28/20 1414 100 %     Weight 03/28/20 1412 140 lb (63.5 kg)     Height 03/28/20 1412 5\' 3"  (1.6 m)     Head Circumference --      Peak Flow --      Pain Score 03/28/20 1411 7     Pain Loc --      Pain Edu? --      Excl. in GC? --    No data found.  Updated Vital Signs BP (!) 130/92 (BP Location: Right Arm)   Pulse 63   Temp 98.5 F (36.9 C) (Oral)   Resp 18   Ht 5\' 3"  (1.6 m)   Wt 63.5 kg   SpO2 100%   BMI 24.80 kg/m   Visual Acuity Right Eye Distance:   Left Eye Distance:   Bilateral Distance:    Right Eye Near:   Left Eye Near:    Bilateral Near:     Physical Exam Vitals and nursing note reviewed.  Constitutional:      General: She is not in acute distress.    Appearance: She is not toxic-appearing or diaphoretic.  HENT:     Right Ear: Tympanic membrane normal.     Left Ear: Tympanic membrane normal.     Nose: Congestion and rhinorrhea present.  Eyes:     Extraocular Movements: Extraocular movements intact.     Pupils: Pupils are equal, round, and reactive to light.  Cardiovascular:     Rate and Rhythm: Normal rate.     Heart sounds: Normal heart sounds.  Pulmonary:     Effort: Pulmonary effort is normal. No respiratory distress.     Breath sounds: Normal breath sounds. No stridor. No wheezing, rhonchi or rales.  Musculoskeletal:     Cervical back: Neck supple.  Neurological:     General: No focal deficit present.     Mental Status: She is alert.      UC Treatments / Results  Labs (all labs ordered are listed, but only abnormal results are displayed) Labs Reviewed  COMPREHENSIVE METABOLIC PANEL - Abnormal; Notable for the following components:      Result Value   Glucose, Bld 111 (*)    BUN 25 (*)    All other components within normal limits  SARS CORONAVIRUS 2 (TAT 6-24 HRS)  CBC WITH DIFFERENTIAL/PLATELET  TSH     EKG   Radiology No results found.  Procedures ED EKG  Date/Time: 03/28/2020 3:13 PM Performed by:  Payton Mccallum, MD Authorized by: Payton Mccallum, MD   ECG reviewed by ED Physician in the absence of a cardiologist: yes   Interpretation:    Interpretation: normal   Rate:    ECG rate assessment: normal   Rhythm:    Rhythm: sinus rhythm   Ectopy:    Ectopy: none   QRS:    QRS axis:  Normal   QRS intervals:  Normal Conduction:    Conduction: normal   ST segments:    ST segments:  Normal T waves:    T waves: normal     (including critical care time)  Medications Ordered in UC Medications - No data to display  Initial Impression / Assessment and Plan / UC Course  I have reviewed the triage vital signs and the nursing notes.  Pertinent labs & imaging results that were available during my care of the patient were reviewed by me and considered in my medical decision making (see chart for details).      Final Clinical Impressions(s) / UC Diagnoses   Final diagnoses:  Viral URI  Fatigue, unspecified type    ED Prescriptions    None     1. Labs/ekg results and diagnosis reviewed with patient 2. Recommend supportive treatment with rest, fluids, otc meds prn 3. Follow up with PCP  4. Follow-up prn  PDMP not reviewed this encounter.   Payton Mccallum, MD 03/28/20 [phone]

## 2020-03-28 NOTE — ED Triage Notes (Signed)
Patient c/o weakness, fatigue, chest heaviness, headache and dry cough x 3 days.

## 2020-03-28 NOTE — Discharge Instructions (Signed)
Rest, fluids Await thyroid test Follow up with Primary Care provider for chronic thyroid medication management

## 2020-03-29 LAB — SARS CORONAVIRUS 2 (TAT 6-24 HRS): SARS Coronavirus 2: NEGATIVE

## 2020-07-04 DIAGNOSIS — F332 Major depressive disorder, recurrent severe without psychotic features: Secondary | ICD-10-CM | POA: Diagnosis not present

## 2020-07-04 DIAGNOSIS — F5001 Anorexia nervosa, restricting type: Secondary | ICD-10-CM | POA: Diagnosis not present

## 2020-07-04 DIAGNOSIS — F411 Generalized anxiety disorder: Secondary | ICD-10-CM | POA: Diagnosis not present

## 2020-07-18 DIAGNOSIS — F5001 Anorexia nervosa, restricting type: Secondary | ICD-10-CM | POA: Diagnosis not present

## 2020-07-18 DIAGNOSIS — F332 Major depressive disorder, recurrent severe without psychotic features: Secondary | ICD-10-CM | POA: Diagnosis not present

## 2020-07-18 DIAGNOSIS — F411 Generalized anxiety disorder: Secondary | ICD-10-CM | POA: Diagnosis not present

## 2020-07-27 IMAGING — CR DG KNEE COMPLETE 4+V*L*
4 series · 4 of 4 positions shown · non-contrast
Comparison: None.

CLINICAL DATA: Left knee pain for 4 weeks

EXAM:
LEFT KNEE - COMPLETE 4+ VIEW

[knee ap (1 of 3)]
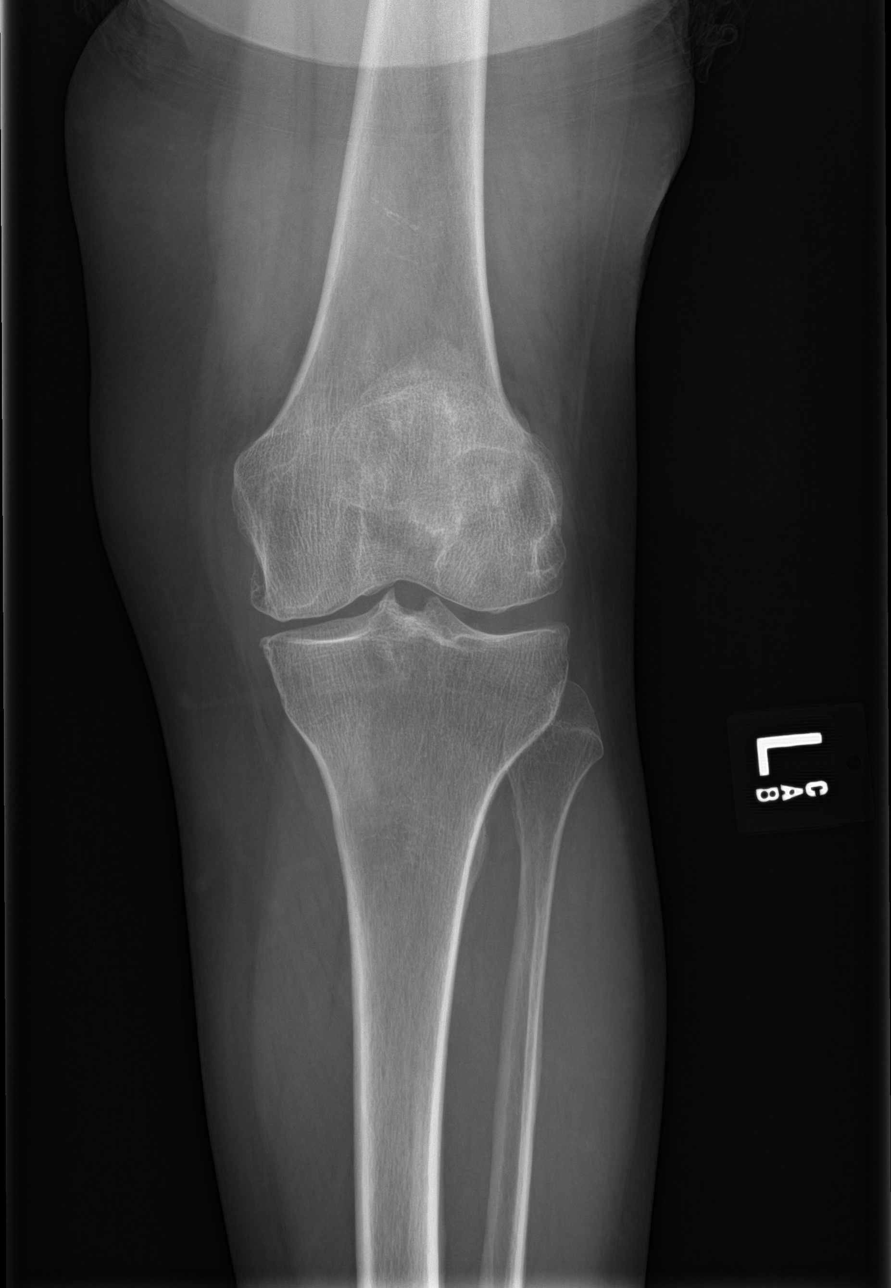

[knee lat]
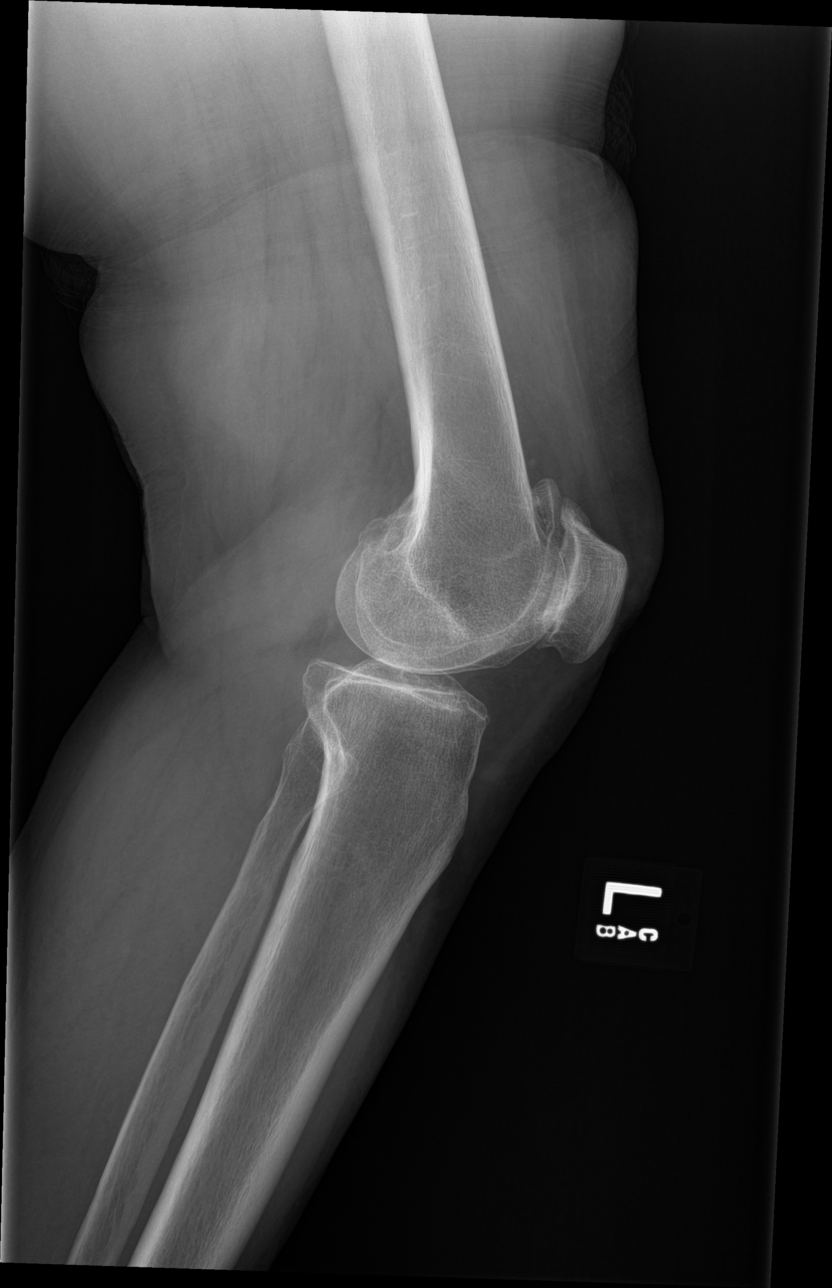

[knee ap (2 of 3)]
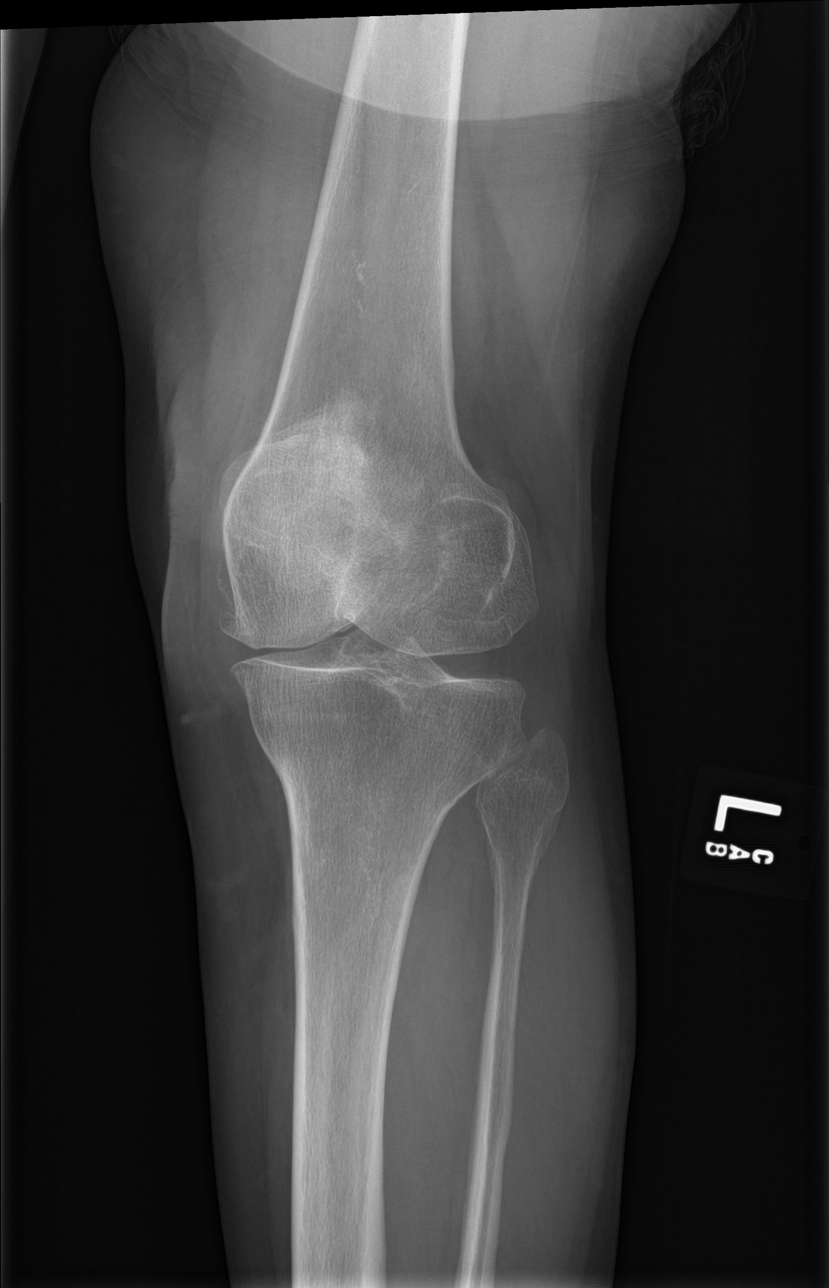

[knee ap (3 of 3)]
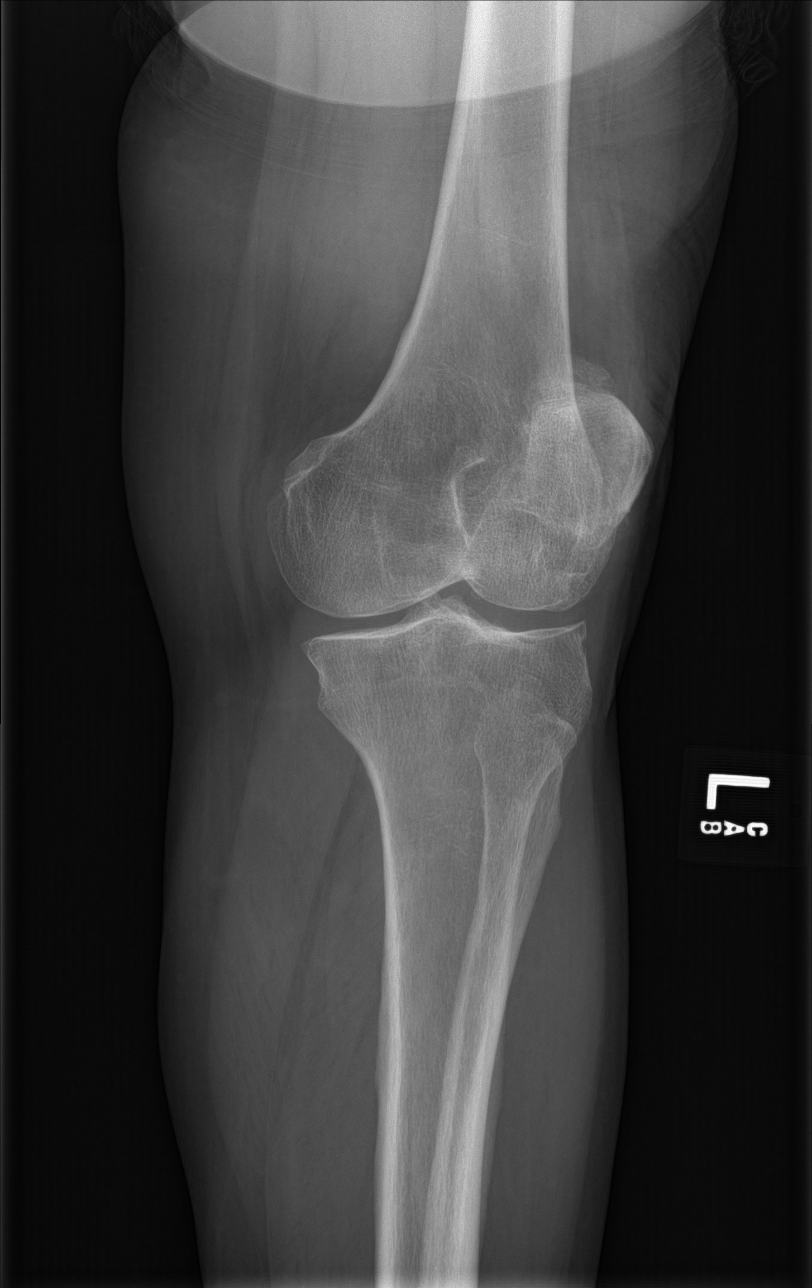

[4 of 4 positions shown; findings below may reference images not displayed]

FINDINGS: No fracture or dislocation. Generalized osteopenia. Severe
patellofemoral compartment osteoarthritis. Mild medial and lateral
femorotibial compartment osteoarthritis. Tricompartmental marginal
osteophytes. No significant joint effusion. No aggressive osseous
lesion. Soft tissues are normal.
IMPRESSION: 1.  No acute osseous injury of the left knee.

## 2020-08-04 ENCOUNTER — Emergency Department
Admission: EM | Admit: 2020-08-04 | Discharge: 2020-08-04 | Disposition: A | Discharge status: Home / Self Care | Payer: Self-pay | Attending: Emergency Medicine | Admitting: Emergency Medicine

## 2020-08-04 ENCOUNTER — Other Ambulatory Visit: Payer: Self-pay

## 2020-08-04 ENCOUNTER — Emergency Department: Payer: Self-pay

## 2020-08-04 DIAGNOSIS — S99911A Unspecified injury of right ankle, initial encounter: Secondary | ICD-10-CM | POA: Insufficient documentation

## 2020-08-04 DIAGNOSIS — R55 Syncope and collapse: Secondary | ICD-10-CM | POA: Insufficient documentation

## 2020-08-04 DIAGNOSIS — I959 Hypotension, unspecified: Secondary | ICD-10-CM | POA: Diagnosis not present

## 2020-08-04 DIAGNOSIS — Z79899 Other long term (current) drug therapy: Secondary | ICD-10-CM | POA: Insufficient documentation

## 2020-08-04 DIAGNOSIS — E860 Dehydration: Secondary | ICD-10-CM | POA: Insufficient documentation

## 2020-08-04 DIAGNOSIS — R42 Dizziness and giddiness: Secondary | ICD-10-CM | POA: Diagnosis not present

## 2020-08-04 DIAGNOSIS — Z8616 Personal history of COVID-19: Secondary | ICD-10-CM | POA: Insufficient documentation

## 2020-08-04 DIAGNOSIS — E039 Hypothyroidism, unspecified: Secondary | ICD-10-CM | POA: Insufficient documentation

## 2020-08-04 DIAGNOSIS — Y9289 Other specified places as the place of occurrence of the external cause: Secondary | ICD-10-CM | POA: Insufficient documentation

## 2020-08-04 DIAGNOSIS — R531 Weakness: Secondary | ICD-10-CM

## 2020-08-04 DIAGNOSIS — Z87891 Personal history of nicotine dependence: Secondary | ICD-10-CM | POA: Insufficient documentation

## 2020-08-04 DIAGNOSIS — W1839XA Other fall on same level, initial encounter: Secondary | ICD-10-CM | POA: Insufficient documentation

## 2020-08-04 DIAGNOSIS — I10 Essential (primary) hypertension: Secondary | ICD-10-CM | POA: Insufficient documentation

## 2020-08-04 LAB — TROPONIN I (HIGH SENSITIVITY)
Troponin I (High Sensitivity): 3 ng/L (ref <18)
Troponin I (High Sensitivity): 3 ng/L (ref <18)

## 2020-08-04 LAB — COMPREHENSIVE METABOLIC PANEL
ALT: 33 U/L (ref 0–44)
AST: 52 U/L — ABNORMAL HIGH (ref 15–41)
Albumin: 4.3 g/dL (ref 3.5–5.0)
Alkaline Phosphatase: 62 U/L (ref 38–126)
Anion gap: 13 (ref 5–15)
BUN: 19 mg/dL (ref 6–20)
CO2: 19 mmol/L — ABNORMAL LOW (ref 22–32)
Calcium: 8.8 mg/dL — ABNORMAL LOW (ref 8.9–10.3)
Chloride: 107 mmol/L (ref 98–111)
Creatinine, Ser: 0.89 mg/dL (ref 0.44–1.00)
GFR, Estimated: >60 mL/min (ref >60)
Glucose, Bld: 88 mg/dL (ref 70–99)
Potassium: 4.3 mmol/L (ref 3.5–5.1)
Sodium: 139 mmol/L (ref 135–145)
Total Bilirubin: 0.9 mg/dL (ref 0.3–1.2)
Total Protein: 7.5 g/dL (ref 6.5–8.1)

## 2020-08-04 LAB — URINALYSIS, COMPLETE (UACMP) WITH MICROSCOPIC
Bacteria, UA: NONE SEEN
Bilirubin Urine: NEGATIVE
Glucose, UA: NEGATIVE mg/dL
Hgb urine dipstick: NEGATIVE
Ketones, ur: NEGATIVE mg/dL
Leukocytes,Ua: NEGATIVE
Nitrite: NEGATIVE
Protein, ur: NEGATIVE mg/dL
Specific Gravity, Urine: 1.019 (ref 1.005–1.030)
pH: 5 (ref 5.0–8.0)

## 2020-08-04 LAB — CBC
HCT: 44.5 % (ref 36.0–46.0)
Hemoglobin: 15 g/dL (ref 12.0–15.0)
MCH: 32.4 pg (ref 26.0–34.0)
MCHC: 33.7 g/dL (ref 30.0–36.0)
MCV: 96.1 fL (ref 80.0–100.0)
Platelets: 258 10*3/uL (ref 150–400)
RBC: 4.63 MIL/uL (ref 3.87–5.11)
RDW: 13.5 % (ref 11.5–15.5)
WBC: 9.4 10*3/uL (ref 4.0–10.5)
nRBC: 0 % (ref 0.0–0.2)

## 2020-08-04 MED ORDER — SODIUM CHLORIDE 0.9 % IV BOLUS
1000.0000 mL | Freq: Once | INTRAVENOUS | Status: AC
  Administered 2020-08-04: 1000 mL via INTRAVENOUS

## 2020-08-04 NOTE — ED Notes (Signed)
Pt verbalized understanding of d/c instructions at this time. Pt denies further questions. Pt assisted to POV in wheelchair at this time. NAD noted

## 2020-08-04 NOTE — ED Triage Notes (Signed)
EMS brings pt in from home for c/o weakness, dizziness x 2 days; also reports rt ankle pain after fall yesterday; syncopal episode PTA with no injuries; ran out of BP and thyroid meds 64mos ago

## 2020-08-04 NOTE — ED Notes (Signed)
Per EDP [facility], d/c after bolus finishes and orthostatics if measurements are stable.

## 2020-08-04 NOTE — ED Notes (Signed)
Pt visualized in NAD at this time. Pt states "I really don't feel good". Pt remains A&O and sitting in wheelchair at this time. This RN apologized for delay.

## 2020-08-04 NOTE — ED Notes (Signed)
Symptoms discussed with Dr. Roxan Hockey, no imaging to be done at this time.

## 2020-08-04 NOTE — ED Triage Notes (Signed)
Pt states she has had a lot of stressors lately, working a lot and not eating well. States 2 days ago she started having generalized weakness, and yesterday she had syncopal episode. Does not have any injuries from the fall, does have right ankle pain from a fall the previous day.

## 2020-08-04 NOTE — ED Provider Notes (Addendum)
[facility] Emergency Department Provider Note   ____________________________________________   Event Date/Time   First MD Initiated Contact with Patient 08/04/20 1120     (approximate)  I have reviewed the triage vital signs and the nursing notes.   HISTORY  Chief Complaint Weakness    HPI Allison Coffey is a 59 y.o. female with stated past medical history hypertension, hyperlipidemia, and thyroid disease who presents for a syncopal episode that occurred just prior to arrival.  Patient states that for the last 2 days she has been experiencing generalized weakness, decreased p.o. intake, and increased lightheadedness.  Patient states that this has been due to her husband having recent open heart surgery and causing significant stress in her life in addition to her work.  Patient describes an episode when she woke up at approximately 2 AM this evening and attempted to use the restroom but became significantly lightheaded.  Patient attempted to get up from the toilet and returned to bed when she lost consciousness on the bedroom floor without any significant head trauma as patient states that her husband caught her.  Patient awoke less than 1 minute later and was the oriented per her husband.  Denies any oral trauma, bowel/bladder incontinence, or postictal period.         Past Medical History:  Diagnosis Date  . Anorexia 1988  . COVID-19 07/2019  . Hyperlipidemia   . Hypertension   . Syncope y-4  . Thyroid disease     Patient Active Problem List   Diagnosis Date Noted  . Alcohol abuse counseling and surveillance 06/22/2018  . Right facial numbness 05/26/2018  . B12 deficiency 05/22/2018  . Hypokalemia 05/22/2018  . GAD (generalized anxiety disorder) 04/15/2016  . 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 Hypertension 01/27/2016  . Visit for preventive health examination 12/10/2015  . S/P abdominal supracervical subtotal hysterectomy  12/07/2015  . Anorexia nervosa,restricting type, in partial remission, severe 01/23/2015  . Hypothyroidism 01/23/2015    Past Surgical History:  Procedure Laterality Date  . ABDOMINAL HYSTERECTOMY  2012   Uterus only.  . CHOLECYSTECTOMY  1989  . ROTATOR CUFF REPAIR Left 2012    Prior to Admission medications   Medication Sig Start Date End Date Taking? Authorizing Provider  ALPRAZolam Prudy Feeler) 0.5 MG tablet TAKE ONE TABLET BY MOUTH AT BEDTIME AS NEEDED FOR ANXIETY 09/10/19   Sherlene Shams, MD  amLODipine (NORVASC) 5 MG tablet TAKE 1 TABLET BY MOUTH EVERY DAY 03/22/20   Sherlene Shams, MD  buPROPion (WELLBUTRIN XL) 150 MG 24 hr tablet TAKE 1 TABLET BY MOUTH EVERY DAY 06/15/19   Sherlene Shams, MD  levothyroxine (SYNTHROID) 88 MCG tablet TAKE 1 TABLET (88 MCG TOTAL) BY MOUTH DAILY BEFORE BREAKFAST. 03/03/20   Sherlene Shams, MD  losartan (COZAAR) 100 MG tablet TAKE 1/2 TABLET BY MOUTH EVERY DAY 09/15/19   Sherlene Shams, MD  metoprolol succinate (TOPROL-XL) 25 MG 24 hr tablet TAKE 1 TABLET BY MOUTH EVERY DAY 07/07/19   Sherlene Shams, MD  ondansetron (ZOFRAN-ODT) 4 MG disintegrating tablet Take 1 tablet (4 mg total) by mouth every 8 (eight) hours as needed for nausea or vomiting. 10/14/18   Tommie Sams, DO  predniSONE (STERAPRED UNI-PAK 21 TAB) 10 MG (21) TBPK tablet Take by mouth daily. Take 6 tabs by mouth 1st 2 days then decrease by 1 tablet every 2 days until finished on day 12. 05/31/19   Amyot, Ali Lowe, NP  temazepam (RESTORIL) 22.5 MG capsule TAKE 1 CAPSULE (22.5 MG TOTAL) BY MOUTH AT BEDTIME AS NEEDED FOR SLEEP. 07/07/19   Sherlene Shams, MD    Allergies Patient has no known allergies.  Family History  Problem Relation Age of Onset  . Heart disease Mother 9  . Stroke Mother 80  . Stroke Maternal Grandmother   . Heart disease Maternal Grandmother   . Heart disease Paternal Grandmother   . Heart disease Paternal Grandfather   . Healthy Father   . Breast cancer Neg Hx      Social History Social History   Tobacco Use  . Smoking status: Former Smoker    Quit date: 01/19/1995    Years since quitting: 25.5  . Smokeless tobacco: Never Used  Vaping Use  . Vaping Use: Never used  Substance Use Topics  . Alcohol use: Yes    Alcohol/week: 0.0 standard drinks    Comment: occasionally  . Drug use: No    Review of Systems Constitutional: No fever/chills Eyes: No visual changes. ENT: No sore throat. Cardiovascular: Denies chest pain. Respiratory: Denies shortness of breath. Gastrointestinal: No abdominal pain.  No nausea, no vomiting.  No diarrhea. Genitourinary: Negative for dysuria. Musculoskeletal: Negative for acute arthralgias Skin: Negative for rash. Neurological: Negative for headaches, numbness/paresthesias in any extremity Psychiatric: Negative for suicidal ideation/homicidal ideation   ____________________________________________   PHYSICAL EXAM:  VITAL SIGNS: ED Triage Vitals  Enc Vitals Group     BP 08/04/20 0550 132/76     Pulse Rate 08/04/20 0550 85     Resp 08/04/20 0550 20     Temp 08/04/20 0549 98.8 F (37.1 C)     Temp Source 08/04/20 0549 Oral     SpO2 08/04/20 0530 97 %     Weight 08/04/20 0550 148 lb (67.1 kg)     Height 08/04/20 0550 5\' 3"  (1.6 m)     Head Circumference --      Peak Flow --      Pain Score 08/04/20 0550 7     Pain Loc --      Pain Edu? --      Excl. in GC? --    Constitutional: Alert and oriented. Well appearing and in no acute distress. Eyes: Conjunctivae are normal. PERRL. Head: Atraumatic. Nose: No congestion/rhinnorhea. Mouth/Throat: Mucous membranes are moist. Neck: No stridor Cardiovascular: Grossly normal heart sounds.  Good peripheral circulation. Respiratory: Normal respiratory effort.  No retractions. Gastrointestinal: Soft and nontender. No distention. Musculoskeletal: No obvious deformities Neurologic:  Normal speech and language. No gross focal neurologic deficits are  appreciated. Skin:  Skin is warm and dry. No rash noted. Psychiatric: Mood and affect are normal. Speech and behavior are normal.  ____________________________________________   LABS (all labs ordered are listed, but only abnormal results are displayed)  Labs Reviewed  COMPREHENSIVE METABOLIC PANEL - Abnormal; Notable for the following components:      Result Value   CO2 19 (*)    Calcium 8.8 (*)    AST 52 (*)    All other components within normal limits  URINALYSIS, COMPLETE (UACMP) WITH MICROSCOPIC - Abnormal; Notable for the following components:   Color, Urine YELLOW (*)    APPearance HAZY (*)    All other components within normal limits  CBC  TROPONIN I (HIGH SENSITIVITY)  TROPONIN I (HIGH SENSITIVITY)   ____________________________________________  EKG  ED ECG REPORT I, 14/09/21, the attending physician, personally viewed and interpreted this ECG.  Date: 08/04/2020  EKG Time: 0551 Rate: 78 Rhythm: normal sinus rhythm QRS Axis: normal Intervals: normal ST/T Wave abnormalities: normal Narrative Interpretation: no evidence of acute ischemia  ____________________________________________  RADIOLOGY  ED MD interpretation: Single 3 view of the right ankle shows only soft tissue swelling without any acute fractures or dislocations  Official radiology report(s): DG Ankle Complete Right  Result Date: 08/04/2020 CLINICAL DATA:  Right ankle injury 2 days ago EXAM: RIGHT ANKLE - COMPLETE 3+ VIEW COMPARISON:  None. FINDINGS: Lateral soft tissue swelling. No acute fracture or subluxation. No convincing ankle joint effusion. Plantar heel spur. IMPRESSION: Soft tissue swelling without fracture. Electronically Signed   By: Marnee Spring M.D.   On: 08/04/2020 07:26    ____________________________________________   PROCEDURES  Procedure(s) performed (including Critical Care):  .1-3 Lead EKG Interpretation Performed by: Merwyn Katos, MD Authorized by: Merwyn Katos, MD     Interpretation: normal     ECG rate:  75   ECG rate assessment: normal     Rhythm: sinus rhythm     Ectopy: none     Conduction: normal       ____________________________________________   INITIAL IMPRESSION / ASSESSMENT AND PLAN / ED COURSE  As part of my medical decision making, I reviewed the following data within the electronic MEDICAL RECORD NUMBER Nursing notes reviewed and incorporated, Labs reviewed, EKG interpreted, Old chart reviewed, Radiograph reviewed and Notes from prior ED visits reviewed and incorporated        Patient presents with complaints of syncope/presyncope ED Workup:  CBC, BMP, Troponin, BNP, ECG, CXR Differential diagnosis includes HF, ICH, seizure, stroke, HOCM, ACS, aortic dissection, malignant arrhythmia, or GI bleed. Findings: No evidence of acute laboratory abnormalities.  Troponin negative x1 EKG: No e/o STEMI. No evidence of Brugadas sign, delta wave, epsilon wave, significantly prolonged QTc, or malignant arrhythmia.  Disposition: Discharge. Patient is at baseline at this time. Return precautions expressed and understood in person. Advised follow up with primary care provider or clinic physician in next 24 hours.      ____________________________________________   FINAL CLINICAL IMPRESSION(S) / ED DIAGNOSES  Final diagnoses:  Syncope, unspecified syncope type  Dehydration  Generalized weakness  Injury of right ankle, initial encounter     ED Discharge Orders    None       Note:  This document was prepared using Dragon voice recognition software and may include unintentional dictation errors.   Merwyn Katos, MD 08/04/20 1152    Merwyn Katos, MD 08/04/20 1153

## 2020-08-04 NOTE — ED Notes (Signed)
Pt given graham crackers, peanut butter, and ginger ale at this time

## 2020-08-16 ENCOUNTER — Other Ambulatory Visit: Payer: Self-pay

## 2020-08-16 ENCOUNTER — Ambulatory Visit: Payer: BC Managed Care – PPO

## 2020-08-16 ENCOUNTER — Ambulatory Visit
Admission: RE | Admit: 2020-08-16 | Discharge: 2020-08-16 | Disposition: A | Discharge status: Home / Self Care | Payer: BC Managed Care – PPO | Source: Ambulatory Visit | Attending: Family Medicine | Admitting: Family Medicine

## 2020-08-16 VITALS — BP 153/111 | HR 73 | Temp 98.4°F | Resp 18 | Ht 63.0 in | Wt 147.9 lb

## 2020-08-16 DIAGNOSIS — M25561 Pain in right knee: Secondary | ICD-10-CM

## 2020-08-16 DIAGNOSIS — M25562 Pain in left knee: Secondary | ICD-10-CM | POA: Diagnosis not present

## 2020-08-16 MED ORDER — DICLOFENAC SODIUM 75 MG PO TBEC
75.0000 mg | DELAYED_RELEASE_TABLET | Freq: Two times a day (BID) | ORAL | 0 refills | Status: DC | PRN
Start: 2020-08-16 — End: 2022-01-11

## 2020-08-16 NOTE — ED Provider Notes (Signed)
[facility]    CSN: [id number] Arrival date & time: 08/16/20  1633      History   Chief Complaint Chief Complaint  Patient presents with  . Knee Pain    bilateral  . Appointment   HPI  59 year old female presents with the above complaints.  Patient reports that she suffered a fall on Saturday.  She states that she was carrying wood to the house and subsequently fell and landed directly on both knees.  Reports bilateral anterior knee pain.  Also reports posterior knee pain.  Bilateral.  She states that her pain is quite severe, 8/10 in severity.  Worse with activity and worse at the end of the day.  No relief with Motrin.  No other medications tried.  She has been icing the knees without resolution.  No other complaints at this time.  Past Medical History:  Diagnosis Date  . Anorexia 1988  . COVID-19 07/2019  . Hyperlipidemia   . Hypertension   . Syncope y-4  . Thyroid disease     Patient Active Problem List   Diagnosis Date Noted  . Alcohol abuse counseling and surveillance 06/22/2018  . Right facial numbness 05/26/2018  . B12 deficiency 05/22/2018  . Hypokalemia 05/22/2018  . GAD (generalized anxiety disorder) 04/15/2016  . 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 Hypertension 01/27/2016  . Visit for preventive health examination 12/10/2015  . S/P abdominal supracervical subtotal hysterectomy 12/07/2015  . Anorexia nervosa,restricting type, in partial remission, severe 01/23/2015  . Hypothyroidism 01/23/2015    Past Surgical History:  Procedure Laterality Date  . ABDOMINAL HYSTERECTOMY  2012   Uterus only.  . CHOLECYSTECTOMY  1989  . ROTATOR CUFF REPAIR Left 2012    OB History   No obstetric history on file.      Home Medications    Prior to Admission medications   Medication Sig Start Date End Date Taking? Authorizing Provider  ALPRAZolam Prudy Feeler) 0.5 MG tablet TAKE ONE TABLET BY MOUTH AT BEDTIME AS NEEDED FOR ANXIETY 09/10/19  Yes  Sherlene Shams, MD  amLODipine (NORVASC) 5 MG tablet TAKE 1 TABLET BY MOUTH EVERY DAY 03/22/20  Yes Sherlene Shams, MD  buPROPion (WELLBUTRIN XL) 150 MG 24 hr tablet TAKE 1 TABLET BY MOUTH EVERY DAY 06/15/19  Yes Sherlene Shams, MD  levothyroxine (SYNTHROID) 88 MCG tablet TAKE 1 TABLET (88 MCG TOTAL) BY MOUTH DAILY BEFORE BREAKFAST. 03/03/20  Yes Sherlene Shams, MD  losartan (COZAAR) 100 MG tablet TAKE 1/2 TABLET BY MOUTH EVERY DAY 09/15/19  Yes Sherlene Shams, MD  diclofenac (VOLTAREN) 75 MG EC tablet Take 1 tablet (75 mg total) by mouth 2 (two) times daily as needed for mild pain or moderate pain. 08/16/20   Tommie Sams, DO  metoprolol succinate (TOPROL-XL) 25 MG 24 hr tablet TAKE 1 TABLET BY MOUTH EVERY DAY 07/07/19 08/16/20  Sherlene Shams, MD  temazepam (RESTORIL) 22.5 MG capsule TAKE 1 CAPSULE (22.5 MG TOTAL) BY MOUTH AT BEDTIME AS NEEDED FOR SLEEP. 07/07/19 08/16/20  Sherlene Shams, MD    Family History Family History  Problem Relation Age of Onset  . Heart disease Mother 20  . Stroke Mother 42  . Stroke Maternal Grandmother   . Heart disease Maternal Grandmother   . Heart disease Paternal Grandmother   . Heart disease Paternal Grandfather   . Healthy Father   . Breast cancer Neg Hx     Social History Social History   Tobacco  Use  . Smoking status: Former Smoker    Quit date: 01/19/1995    Years since quitting: 25.5  . Smokeless tobacco: Never Used  Vaping Use  . Vaping Use: Never used  Substance Use Topics  . Alcohol use: Yes    Alcohol/week: 0.0 standard drinks    Comment: occasionally  . Drug use: No     Allergies   Patient has no known allergies.   Review of Systems Review of Systems  Constitutional: Negative.   Musculoskeletal:       Bilateral knee pain.   Physical Exam Triage Vital Signs ED Triage Vitals  Enc Vitals Group     BP 08/16/20 1658 (!) 153/111     Pulse Rate 08/16/20 1658 73     Resp 08/16/20 1658 18     Temp 08/16/20 1658 98.4 F  (36.9 C)     Temp Source 08/16/20 1658 Oral     SpO2 08/16/20 1658 100 %     Weight 08/16/20 1656 147 lb 14.9 oz (67.1 kg)     Height 08/16/20 1656 5\' 3"  (1.6 m)     Head Circumference --      Peak Flow --      Pain Score 08/16/20 1656 8     Pain Loc --      Pain Edu? --      Excl. in GC? --    Updated Vital Signs BP (!) 153/111 (BP Location: Left Arm) Comment: pt states she is out of her BP medication  Pulse 73   Temp 98.4 F (36.9 C) (Oral)   Resp 18   Ht 5\' 3"  (1.6 m)   Wt 67.1 kg   SpO2 100%   BMI 26.20 kg/m   Visual Acuity Right Eye Distance:   Left Eye Distance:   Bilateral Distance:    Right Eye Near:   Left Eye Near:    Bilateral Near:     Physical Exam Vitals and nursing note reviewed.  Constitutional:      General: She is not in acute distress.    Appearance: Normal appearance. She is not ill-appearing.  HENT:     Head: Normocephalic and atraumatic.  Eyes:     General:        Right eye: No discharge.        Left eye: No discharge.     Conjunctiva/sclera: Conjunctivae normal.  Pulmonary:     Effort: Pulmonary effort is normal. No respiratory distress.  Musculoskeletal:     Comments: Knees - Left and Right No apparent effusion. No anterior joint line tenderness. Ligaments intact.  Neurological:     Mental Status: She is alert.  Psychiatric:        Mood and Affect: Mood normal.        Behavior: Behavior normal.    UC Treatments / Results  Labs (all labs ordered are listed, but only abnormal results are displayed) Labs Reviewed - No data to display  EKG   Radiology No results found.  Procedures Procedures (including critical care time)  Medications Ordered in UC Medications - No data to display  Initial Impression / Assessment and Plan / UC Course  I have reviewed the triage vital signs and the nursing notes.  Pertinent labs & imaging results that were available during my care of the patient were reviewed by me and considered in my  medical decision making (see chart for details).    59 year old female presents with bilateral knee pain.  I  suspect that the patient has underlying osteoarthritis of the knees.  Fall has flared this.  Diclofenac as prescribed.  Discussed imaging and patient elected not to proceed.  Final Clinical Impressions(s) / UC Diagnoses   Final diagnoses:  Acute pain of both knees     Discharge Instructions     Rest, ice, elevation.  Medication as directed.  Take care  Dr. Adriana Simas    ED Prescriptions    Medication Sig Dispense Auth. Provider   diclofenac (VOLTAREN) 75 MG EC tablet Take 1 tablet (75 mg total) by mouth 2 (two) times daily as needed for mild pain or moderate pain. 60 tablet Everlene Other G, DO     PDMP not reviewed this encounter.   Tommie Sams, [place] 08/16/20 1729

## 2020-08-16 NOTE — ED Triage Notes (Signed)
Pt c/o bilateral knee pain. She states she fell a couple of days ago on grass in her back yard. She states her knees feel swollen on top and behind the knee.

## 2020-08-16 NOTE — Discharge Instructions (Addendum)
Rest, ice, elevation. ° °Medication as directed. ° °Take care ° °Dr. Dollie Bressi  °

## 2020-09-05 ENCOUNTER — Other Ambulatory Visit: Payer: Self-pay | Admitting: Family Medicine

## 2021-02-13 ENCOUNTER — Other Ambulatory Visit: Payer: Self-pay | Admitting: Internal Medicine

## 2021-02-14 DIAGNOSIS — D485 Neoplasm of uncertain behavior of skin: Secondary | ICD-10-CM | POA: Diagnosis not present

## 2021-04-12 ENCOUNTER — Other Ambulatory Visit: Payer: Self-pay | Admitting: Internal Medicine

## 2021-07-25 ENCOUNTER — Other Ambulatory Visit: Payer: Self-pay | Admitting: Internal Medicine

## 2021-12-10 ENCOUNTER — Other Ambulatory Visit: Payer: Self-pay | Admitting: Internal Medicine

## 2021-12-11 NOTE — Telephone Encounter (Signed)
I tried to reach patient bc she has not been seen since 2021 & has no recent labs. Needs appointment for refills.  ?

## 2022-01-05 ENCOUNTER — Other Ambulatory Visit: Payer: Self-pay | Admitting: Internal Medicine

## 2022-01-08 NOTE — Telephone Encounter (Signed)
LMTCB to schedule an appointment & needs TSH lab.  ?

## 2022-01-11 ENCOUNTER — Ambulatory Visit
Admission: EM | Admit: 2022-01-11 | Discharge: 2022-01-11 | Disposition: A | Discharge status: Home / Self Care | Payer: BC Managed Care – PPO | Attending: Physician Assistant | Admitting: Physician Assistant

## 2022-01-11 ENCOUNTER — Other Ambulatory Visit: Payer: Self-pay

## 2022-01-11 DIAGNOSIS — M5441 Lumbago with sciatica, right side: Secondary | ICD-10-CM

## 2022-01-11 MED ORDER — BACLOFEN 10 MG PO TABS: 10.0000 mg | ORAL_TABLET | Freq: Three times a day (TID) | ORAL | 0 refills | Status: AC | PRN

## 2022-01-11 MED ORDER — PREDNISONE 10 MG PO TABS: ORAL_TABLET | ORAL | 0 refills | Status: DC

## 2022-01-11 NOTE — ED Provider Notes (Signed)
[facility]    CSN: [id number] Arrival date & time: 01/11/22  4098      History   Chief Complaint Chief Complaint  Patient presents with   Hip Pain    HPI Allison Coffey is a 61 y.o. female presenting for approximate 2-week history of right lower back pain with radiation to the right hip, lateral right leg to the knee.  Says it is a constant aching but increases with prolonged sitting.  Pain gets a little better when she gets up and walks around.  Increased pain especially at nighttime.  Not reporting any pain in the leg currently.  No numbness, weakness or tingling.  No injury but patient says she frequently bends over and picks up her grandchildren, one of them was quite heavy in her opinion.  Reports no previous issues with her back or hip.  She has been taking 8 her milligrams ibuprofen 3 times daily and Tylenol as needed just to manage.  She says its not really improving her condition and she feels like things are worsening.  No reports of any loss of bowel or bladder control.  No other complaints.  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   CHOLECYSTECTOMY  1989   ROTATOR CUFF REPAIR Left 2012    OB History   No  obstetric history on file.      Home Medications    Prior to Admission medications   Medication Sig Start Date End Date Taking? Authorizing Provider  ALPRAZolam Prudy Feeler) 0.5 MG tablet TAKE ONE TABLET BY MOUTH AT BEDTIME AS NEEDED FOR ANXIETY 09/10/19  Yes Sherlene Shams, MD  amLODipine (NORVASC) 5 MG tablet TAKE 1 TABLET BY MOUTH EVERY DAY 03/22/20  Yes Sherlene Shams, MD  baclofen (LIORESAL) 10 MG tablet Take 1 tablet (10 mg total) by mouth 3 (three) times daily as needed for up to 7 days for muscle spasms. 01/11/22 01/18/22 Yes Shirlee Latch, PA-C  buPROPion (WELLBUTRIN XL) 150 MG 24 hr tablet TAKE 1 TABLET BY MOUTH EVERY DAY 06/15/19  Yes Sherlene Shams, MD  levothyroxine (SYNTHROID) 88 MCG tablet TAKE 1 TABLET BY MOUTH EVERY DAY BEFORE BREAKFAST 01/08/22  Yes Sherlene Shams, MD  predniSONE (DELTASONE) 10 MG tablet Take 6 tabs p.o. on day 1 and decrease 1 tablet daily until complete 01/11/22  Yes Eusebio Friendly B, PA-C  losartan (COZAAR) 100 MG tablet TAKE 1/2 TABLET BY MOUTH EVERY DAY 09/15/19   Sherlene Shams, MD  metoprolol succinate (TOPROL-XL) 25 MG 24 hr tablet TAKE 1 TABLET BY MOUTH EVERY DAY 07/07/19 08/16/20  Sherlene Shams, MD  temazepam (RESTORIL) 22.5 MG capsule TAKE 1 CAPSULE (22.5 MG TOTAL) BY MOUTH AT BEDTIME AS NEEDED FOR SLEEP.  07/07/19 08/16/20  Sherlene Shams, MD    Family History Family History  Problem Relation Age of Onset   Heart disease Mother 26   Stroke Mother 51   Stroke Maternal Grandmother    Heart disease Maternal Grandmother    Heart disease Paternal Grandmother    Heart disease Paternal Grandfather    Healthy Father    Breast cancer Neg Hx     Social History Social History   Tobacco Use   Smoking status: Former    Types: Cigarettes    Quit date: 01/19/1995    Years since quitting: 26.9   Smokeless tobacco: Never  Vaping Use   Vaping Use: Never used  Substance Use Topics   Alcohol use: Yes    Alcohol/week: 0.0 standard drinks    Comment:  occasionally   Drug use: No     Allergies   Patient has no known allergies.   Review of Systems Review of Systems  Genitourinary:  Negative for dysuria, flank pain and hematuria.  Musculoskeletal:  Positive for arthralgias and back pain. Negative for gait problem.  Neurological:  Negative for weakness and numbness.    Physical Exam Triage Vital Signs ED Triage Vitals [01/11/22 0818]  Enc Vitals Group     BP      Pulse      Resp      Temp      Temp src      SpO2      Weight      Height      Head Circumference      Peak Flow      Pain Score 10     Pain Loc      Pain Edu?      Excl. in GC?    No data found.  Updated Vital Signs BP 131/89 (BP Location: Left Arm)   Pulse 68   Temp 98 F (36.7 C) (Oral)   Resp 18   Ht 5\' 3"  (1.6 m)   Wt 134 lb (60.8 kg)   SpO2 100%   BMI 23.74 kg/m       Physical Exam Vitals and nursing note reviewed.  Constitutional:      General: She is not in acute distress.    Appearance: Normal appearance. She is not ill-appearing or toxic-appearing.  HENT:     Head: Normocephalic and atraumatic.  Eyes:     General: No scleral icterus.       Right eye: No discharge.        Left eye: No discharge.     Conjunctiva/sclera: Conjunctivae normal.  Cardiovascular:     Rate and Rhythm: Normal rate and regular rhythm.  Pulmonary:     Effort: Pulmonary effort is normal. No respiratory distress.  Musculoskeletal:     Cervical back: Neck supple.     Lumbar back: Tenderness (TTP in area outlined in picture, right paralumbar muscles) present. No bony tenderness. Decreased range of motion (increased pain with full flexion, extension reduced by 50% of normal). Negative right straight leg raise test and negative left straight leg raise test.       Back:  Skin:    General: Skin is dry.  Neurological:     General: No focal deficit present.     Mental Status: She is alert. Mental status is at baseline.     Motor: No weakness.     Coordination:  Coordination normal.     Gait: Gait normal.  Psychiatric:  Mood and Affect: Mood normal.        Behavior: Behavior normal.        Thought Content: Thought content normal.     UC Treatments / Results  Labs (all labs ordered are listed, but only abnormal results are displayed) Labs Reviewed - No data to display  EKG   Radiology No results found.  Procedures Procedures (including critical care time)  Medications Ordered in UC Medications - No data to display  Initial Impression / Assessment and Plan / UC Course  I have reviewed the triage vital signs and the nursing notes.  Pertinent labs & imaging results that were available during my care of the patient were reviewed by me and considered in my medical decision making (see chart for details).  61 year old female presenting for 2-week history of progressively worsening right lower back pain with radiation to right buttocks, right lateral leg to the knee.  No associated red flag signs or symptoms.  Vitals are normal and stable and she is overall well-appearing.  Today her forward flexion is normal but she has increased pain with that, extension reduced about 50% of normal due to pain.  Negative straight leg raise bilaterally.  Tenderness palpation of the right paralumbar muscles.  Advised patient symptoms are consistent with lumbar radiculopathy.  We will try a prednisone taper since she is already maxed out the dose of ibuprofen and Tylenol.  Also sent baclofen to pharmacy.  May continue Tylenol.  Reviewed importance of stretches and may continue heat and ice.  Advised her to contact her physical therapist for appointment and flattening better in 2 weeks or worsening symptoms to follow-up with PCP or Ortho to see if she may need an MRI.   Final Clinical Impressions(s) / UC Diagnoses   Final diagnoses:  Acute right-sided low back pain with right-sided sciatica     Discharge Instructions      BACK PAIN: Stressed avoiding  painful activities . RICE (REST, ICE, COMPRESSION, ELEVATION) guidelines reviewed. May alternate ice and heat. Consider use of muscle rubs, Salonpas patches, etc. Use medications as directed including muscle relaxers if prescribed. Take anti-inflammatory medications as prescribed or OTC NSAIDs/Tylenol.  F/u with PCP in 7-10 days for reexamination, and please feel free to call or return to the urgent care at any time for any questions or concerns you may have and we will be happy to help you!   BACK PAIN RED FLAGS: If the back pain acutely worsens or there are any red flag symptoms such as numbness/tingling, leg weakness, saddle anesthesia, or loss of bowel/bladder control, go immediately to the ER. Follow up with us as scheduled or sooner if the pain does not begin to resolve or if it worsens before the follow up    *Contact your physical therapist for appointment. If not better in 2 weeks or symptoms are worsening follow up with PCP or orthopedics to see if you need an MRI.     ED Prescriptions     Medication Sig Dispense Auth. Provider   predniSONE (DELTASONE) 10 MG tablet Take 6 tabs p.o. on day 1 and decrease 1 tablet daily until complete 21 tablet Eusebio FriendlyEaves, Yeraldi Fidler B, PA-C   baclofen (LIORESAL) 10 MG tablet Take 1 tablet (10 mg total) by mouth 3 (three) times daily as needed for up to 7 days for muscle spasms. 20 each Shirlee LatchEaves, Mahi Zabriskie B, PA-C      I have reviewed the PDMP during this encounter.   Shirlee LatchEaves, Lavenia Stumpo B,  PA-C 01/11/22 8588

## 2022-01-11 NOTE — Discharge Instructions (Addendum)
BACK PAIN: Stressed avoiding painful activities . RICE (REST, ICE, COMPRESSION, ELEVATION) guidelines reviewed. May alternate ice and heat. Consider use of muscle rubs, Salonpas patches, etc. Use medications as directed including muscle relaxers if prescribed. Take anti-inflammatory medications as prescribed or OTC NSAIDs/Tylenol.  F/u with PCP in 7-10 days for reexamination, and please feel free to call or return to the urgent care at any time for any questions or concerns you may have and we will be happy to help you!   BACK PAIN RED FLAGS: If the back pain acutely worsens or there are any red flag symptoms such as numbness/tingling, leg weakness, saddle anesthesia, or loss of bowel/bladder control, go immediately to the ER. Follow up with [place] as scheduled or sooner if the pain does not begin to resolve or if it worsens before the follow up    *Contact your physical therapist for appointment. If not better in 2 weeks or symptoms are worsening follow up with PCP or orthopedics to see if you need an MRI.

## 2022-01-11 NOTE — ED Triage Notes (Signed)
Pt c/o right hip pain x2weeks.  Pt has been using ice and heat to help but states that her hip pain has been getting worse.   Pt states that when shes moving she doesn't notice it but when she is still it begins to throb.   Pt states that the pain does go down the right leg and it radiates to the left hip at night.

## 2022-01-30 ENCOUNTER — Other Ambulatory Visit: Payer: Self-pay | Admitting: Internal Medicine

## 2022-03-16 ENCOUNTER — Other Ambulatory Visit: Payer: Self-pay | Admitting: Internal Medicine

## 2022-04-17 ENCOUNTER — Other Ambulatory Visit: Payer: Self-pay | Admitting: Internal Medicine

## 2022-08-08 NOTE — Telephone Encounter (Signed)
MyChart messgae sent to patient. 

## 2023-03-20 ENCOUNTER — Telehealth: Payer: Self-pay

## 2023-03-20 ENCOUNTER — Other Ambulatory Visit: Payer: Self-pay

## 2023-03-20 ENCOUNTER — Ambulatory Visit
Admission: EM | Admit: 2023-03-20 | Discharge: 2023-03-20 | Disposition: A | Discharge status: Home / Self Care | Payer: BC Managed Care – PPO | Attending: Emergency Medicine | Admitting: Emergency Medicine

## 2023-03-20 DIAGNOSIS — U071 COVID-19: Secondary | ICD-10-CM | POA: Diagnosis not present

## 2023-03-20 DIAGNOSIS — Z20822 Contact with and (suspected) exposure to covid-19: Secondary | ICD-10-CM | POA: Insufficient documentation

## 2023-03-20 DIAGNOSIS — J0140 Acute pansinusitis, unspecified: Secondary | ICD-10-CM | POA: Diagnosis not present

## 2023-03-20 LAB — SARS CORONAVIRUS 2 BY RT PCR: SARS Coronavirus 2 by RT PCR: POSITIVE — AB

## 2023-03-20 MED ORDER — NAPROXEN 500 MG PO TABS: 500.0000 mg | ORAL_TABLET | Freq: Two times a day (BID) | ORAL | 0 refills | Status: AC

## 2023-03-20 MED ORDER — FLUTICASONE PROPIONATE 50 MCG/ACT NA SUSP: 2.0000 | Freq: Every day | NASAL | 0 refills | Status: AC

## 2023-03-20 NOTE — Telephone Encounter (Signed)
Left message to return call to notify patient of positive covid results.

## 2023-03-20 NOTE — ED Triage Notes (Signed)
Pt has concern for sinus infection. She has been experiencing sinus drainage, facial pain and ear pressure. X 3 days.

## 2023-03-20 NOTE — ED Provider Notes (Signed)
HPI  SUBJECTIVE:  Allison Coffey is a 62 y.o. female who presents with 3 days of bilateral ear pain, nasal congestion, sinus pain and pressure, postnasal drip, body aches, headaches and occasional nausea, occasional cough.  No fevers, facial swelling, upper dental pain, rhinorrhea, wheezing, shortness of breath, pain, diarrhea, abdominal pain.  No known COVID exposure.  She has gotten 1 dose of the COVID-vaccine.  No antipyretic in the past 6 hours.  No antibiotics in the past month.  She has tried DayQuil, NyQuil, Tylenol, Benadryl and Claritin-D without improvement in her symptoms.  No aggravating factors.  She states the nasal congestion is worse at night.  She is able to sleep at night without waking up coughing.  She has a past medical history of hyperlipidemia, hypothyroidism, well-controlled hypertension, syncope.  PCP: [facility].   Past Medical History:  Diagnosis Date   Anorexia 1988   COVID-19 07/2019   Hyperlipidemia    Hypertension    Syncope y-4   Thyroid disease     Past Surgical History:  Procedure Laterality Date   ABDOMINAL HYSTERECTOMY  2012   Uterus only.   CHOLECYSTECTOMY  1989   ROTATOR CUFF REPAIR Left 2012    Family History  Problem Relation Age of Onset   Heart disease Mother 51   Stroke Mother 38   Stroke Maternal Grandmother    Heart disease Maternal Grandmother    Heart disease Paternal Grandmother    Heart disease Paternal Grandfather    Healthy Father    Breast cancer Neg Hx     Social History   Tobacco Use   Smoking status: Former    Current packs/day: 0.00    Types: Cigarettes    Quit date: 01/19/1995    Years since quitting: 28.1   Smokeless tobacco: Never  Vaping Use   Vaping status: Never Used  Substance Use Topics   Alcohol use: Yes    Alcohol/week: 0.0 standard drinks of alcohol    Comment: occasionally   Drug use: No    No current facility-administered medications for this encounter.  Current Outpatient  Medications:    amLODipine (NORVASC) 5 MG tablet, TAKE 1 TABLET BY MOUTH EVERY DAY, Disp: 90 tablet, Rfl: 3   buPROPion (WELLBUTRIN XL) 150 MG 24 hr tablet, TAKE 1 TABLET BY MOUTH EVERY DAY, Disp: 90 tablet, Rfl: 1   fluticasone (FLONASE) 50 MCG/ACT nasal spray, Place 2 sprays into both nostrils daily., Disp: 16 g, Rfl: 0   levothyroxine (SYNTHROID) 88 MCG tablet, TAKE 1 TABLET BY MOUTH EVERY DAY BEFORE BREAKFAST, Disp: 30 tablet, Rfl: 1   naproxen (NAPROSYN) 500 MG tablet, Take 1 tablet (500 mg total) by mouth 2 (two) times daily., Disp: 20 tablet, Rfl: 0   losartan (COZAAR) 100 MG tablet, TAKE 1/2 TABLET BY MOUTH EVERY DAY, Disp: 45 tablet, Rfl: 1  No Known Allergies   ROS  As noted in HPI.   Physical Exam  BP 127/89   Pulse 71   Temp 98.8 F (37.1 C)   Resp 20   SpO2 100%   Constitutional: Well developed, well nourished, no acute distress Eyes:  EOMI, conjunctiva normal bilaterally HENT: Normocephalic, atraumatic,mucus membranes moist.  TMs normal bilaterally.  Erythematous, swollen turbinates are positive nasal congestion.  Positive maxillary, frontal sinus tenderness.  Normal oropharynx.  No obvious postnasal drip. Neck: Shotty cervical lymphadenopathy Respiratory: Normal inspiratory effort, lungs clear bilaterally. Cardiovascular: Normal rate regular rhythm, no murmurs rubs or gallops GI: nondistended skin: No rash,  skin intact Musculoskeletal: no deformities Neurologic: Alert & oriented x 3, no focal neuro deficits Psychiatric: Speech and behavior appropriate   ED Course   Medications - No data to display  Orders Placed This Encounter  Procedures   SARS Coronavirus 2 by RT PCR (hospital order, performed in [facility] hospital lab) *cepheid single result test* Anterior Nasal Swab    Standing Status:   Standing    Number of Occurrences:   1    Results for orders placed or performed during the hospital encounter of 03/20/23 (from the past 24 hour(s))  SARS  Coronavirus 2 by RT PCR (hospital order, performed in [facility] hospital lab) *cepheid single result test* Anterior Nasal Swab     Status: Abnormal   Collection Time: 03/20/23  8:40 AM   Specimen: Anterior Nasal Swab  Result Value Ref Range   SARS Coronavirus 2 by RT PCR POSITIVE (A) NEGATIVE   No results found.  ED Clinical Impression  1. COVID-19 virus infection   2. Acute non-recurrent pansinusitis   3. Encounter for laboratory testing for COVID-19 virus      ED Assessment/Plan     Patient presents with a sinusitis , most likely viral.  She does not meet criteria for antibiotics just yet.  Will check COVID.  We talked about antivirals if COVID is positive, but she declined.  In the meantime, discontinue over-the-counter medications, start Mucinex D, she is to keep an eye on her blood pressure while taking this.  She will switch to regular Mucinex if it elevates her blood pressure.  Saline nasal irrigation, Flonase, Naprosyn/Tylenol.  She declined a work note.  Follow-up with PCP as needed.  COVID-positive.  Sent MyChart note.  Also will have staff contact patient and notify her of positive result.  Plan as above.  Discussed labs, MDM, treatment plan, and plan for follow-up with patient. patient agrees with plan.   Meds ordered this encounter  Medications   fluticasone (FLONASE) 50 MCG/ACT nasal spray    Sig: Place 2 sprays into both nostrils daily.    Dispense:  16 g    Refill:  0   naproxen (NAPROSYN) 500 MG tablet    Sig: Take 1 tablet (500 mg total) by mouth 2 (two) times daily.    Dispense:  20 tablet    Refill:  0      *This clinic note was created using Scientist, clinical (histocompatibility and immunogenetics). Therefore, there may be occasional mistakes despite careful proofreading.  ?     Domenick Gong, MD 03/20/23 1137

## 2023-03-20 NOTE — Discharge Instructions (Signed)
Will contact you if your COVID comes back positive.Start Mucinex-D to keep the mucous thin and to decongest you.   You may take 500 mg of naprosyn with 1000 mg of tylenol 2 times a day as needed for pain. This is an effective combination for pain.  Most sinus infections are viral and do not need antibiotics unless you have a high fever, have had this for 10 days, or you get better and then get sick again. Use a NeilMed sinus rinse with distilled water as often as you want to to reduce nasal congestion. Follow the directions on the box.  Flonase.  Follow-up with your primary care provider if not better in 10 days or for double sickening.  Go to www.goodrx.com to look up your medications. This will give you a list of where you can find your prescriptions at the most affordable prices. Or you can ask the pharmacist what the cash price is. This is frequently cheaper than going through insurance.

## 2023-05-02 ENCOUNTER — Ambulatory Visit
Admission: EM | Admit: 2023-05-02 | Discharge: 2023-05-02 | Disposition: A | Discharge status: Home / Self Care | Payer: BC Managed Care – PPO | Attending: Physician Assistant | Admitting: Physician Assistant

## 2023-05-02 ENCOUNTER — Telehealth: Payer: Self-pay | Admitting: Physician Assistant

## 2023-05-02 ENCOUNTER — Ambulatory Visit (INDEPENDENT_AMBULATORY_CARE_PROVIDER_SITE_OTHER): Payer: BC Managed Care – PPO

## 2023-05-02 ENCOUNTER — Telehealth: Payer: Self-pay

## 2023-05-02 DIAGNOSIS — M79671 Pain in right foot: Secondary | ICD-10-CM

## 2023-05-02 DIAGNOSIS — M7989 Other specified soft tissue disorders: Secondary | ICD-10-CM | POA: Diagnosis not present

## 2023-05-02 MED ORDER — KETOROLAC TROMETHAMINE 30 MG/ML IJ SOLN
30.0000 mg | Freq: Once | INTRAMUSCULAR | Status: AC
  Administered 2023-05-02: 30 mg via INTRAMUSCULAR

## 2023-05-02 MED ORDER — HYDROCODONE-ACETAMINOPHEN 5-325 MG PO TABS: 1.0000 | ORAL_TABLET | Freq: Three times a day (TID) | ORAL | 0 refills | Status: DC | PRN

## 2023-05-02 MED ORDER — TRAMADOL HCL 50 MG PO TABS: 50.0000 mg | ORAL_TABLET | Freq: Four times a day (QID) | ORAL | 0 refills | Status: AC | PRN

## 2023-05-02 NOTE — Telephone Encounter (Signed)
Called [facility] pharmacy to cancel pt's HYDROcodone-acetaminophen (NORCO) 5-325 MG tablet due to pt stating rx not available at pharmacy.

## 2023-05-02 NOTE — Discharge Instructions (Addendum)
-  Results are pending but upon initial review by myself I did not see any fractures. - Possible sprain of the foot. - We put you in a boot for comfort. - May take NSAID medication, Tylenol or pain medication as needed.  We gave you an injection of ketorolac in the clinic so if you need any additional indication today avoid NSAIDs - If there is a fracture we will need to stay in the boot and follow-up with orthopedics.  You have a condition requiring you to follow up with Orthopedics so please call one of the following office for appointment:   [facility] Address: [address] [place], [place] [address] Phone: [phone]  [facility] [address], [place], [place] [address] Phone: [phone]  [facility] [address] [place], [place] [address] Phone: [phone]

## 2023-05-02 NOTE — ED Triage Notes (Signed)
Pt c/o right foot injury   Pt states that she was sitting in the recliner and when she stood up, she felt a pop in the middle of her foot.  Pt states that her foot is now swollen and she can not put any pressure on her foot.   Pt states that the pain is jabbing and is at the top of her foot. Pt can not move her foot or ankle without pain.   Pt states that there is pain in her right knee as well.

## 2023-05-02 NOTE — Telephone Encounter (Signed)
Pharmacy did not have Norco.  Changed to tramadol.  Other medication canceled.

## 2023-05-02 NOTE — ED Provider Notes (Signed)
[facility]    CSN: [id number] Arrival date & time: 05/02/23  1335      History   Chief Complaint Chief Complaint  Patient presents with   Knee Pain   Foot Pain    HPI Allison Coffey is a 62 y.o. female presenting for right foot and ankle pain/swelling.  Patient says she was sitting in her recliner and when she stood up she felt a pop in the midfoot.  She reports she cannot really walk or bear weight due to the discomfort.  She says this all happened about 2 to 3 hours ago.  She reports sharp stabbing pains in the dorsal foot.  Reports that the pain will shoot up into the ankle and sometimes into the knee.  She denies any trauma or fall.  No history of similar problems.  Has not taken anything so far for pain relief and rates her pain at 10 out of 10.  No numbness, weakness or tingling.  No other complaints.  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   CHOLECYSTECTOMY  1989   ROTATOR CUFF REPAIR Left 2012    OB History   No obstetric history on file.      Home Medications    Prior to Admission medications   Medication Sig Start Date End Date Taking? Authorizing Provider  amLODipine (NORVASC) 5 MG tablet TAKE 1  TABLET BY MOUTH EVERY DAY 03/22/20  Yes Sherlene Shams, MD  buPROPion (WELLBUTRIN XL) 150 MG 24 hr tablet TAKE 1 TABLET BY MOUTH EVERY DAY 06/15/19  Yes Sherlene Shams, MD  fluticasone (FLONASE) 50 MCG/ACT nasal spray Place 2 sprays into both nostrils daily. 03/20/23  Yes Domenick Gong, MD  HYDROcodone-acetaminophen (NORCO) 5-325 MG tablet Take 1 tablet by mouth every 8 (eight) hours as needed for up to 3 days for severe pain. 05/02/23 05/05/23 Yes Shirlee Latch, PA-C  levothyroxine (SYNTHROID) 88 MCG tablet TAKE 1 TABLET BY MOUTH EVERY DAY BEFORE BREAKFAST 04/17/22  Yes Worthy Rancher B, FNP  losartan (COZAAR) 100 MG tablet TAKE 1/2 TABLET BY MOUTH EVERY DAY 09/15/19  Yes Sherlene Shams, MD  naproxen (NAPROSYN) 500 MG tablet Take 1 tablet (500 mg total) by mouth 2 (two) times daily. 03/20/23  Yes Domenick Gong, MD  metoprolol succinate (TOPROL-XL) 25 MG 24 hr tablet TAKE 1 TABLET BY MOUTH EVERY DAY 07/07/19 08/16/20  Sherlene Shams, MD  temazepam (RESTORIL) 22.5 MG capsule TAKE 1 CAPSULE (22.5 MG TOTAL) BY MOUTH AT BEDTIME AS NEEDED FOR SLEEP. 07/07/19 08/16/20  Sherlene Shams, MD    Family History Family History  Problem Relation Age of Onset   Heart disease Mother 57   Stroke Mother 60   Stroke Maternal Grandmother  Heart disease Maternal Grandmother    Heart disease Paternal Grandmother    Heart disease Paternal Grandfather    Healthy Father    Breast cancer Neg Hx     Social History Social History   Tobacco Use   Smoking status: Former    Current packs/day: 0.00    Types: Cigarettes    Quit date: 01/19/1995    Years since quitting: 28.3   Smokeless tobacco: Never  Vaping Use   Vaping status: Never Used  Substance Use Topics   Alcohol use: Yes    Alcohol/week: 0.0 standard drinks of alcohol    Comment: occasionally   Drug use: No     Allergies   Patient has no known allergies.   Review of Systems Review of Systems  Musculoskeletal:  Positive for  arthralgias, gait problem and joint swelling.  Skin:  Negative for color change and wound.  Neurological:  Negative for weakness and numbness.     Physical Exam Triage Vital Signs ED Triage Vitals  Encounter Vitals Group     BP      Systolic BP Percentile      Diastolic BP Percentile      Pulse      Resp      Temp      Temp src      SpO2      Weight      Height      Head Circumference      Peak Flow      Pain Score      Pain Loc      Pain Education      Exclude from Growth Chart    No data found.  Updated Vital Signs BP 138/89 (BP Location: Left Arm)   Pulse 73   Temp 98.5 F (36.9 C) (Oral)   Ht 5\' 3"  (1.6 m)   Wt 133 lb (60.3 kg)   SpO2 100%   BMI 23.56 kg/m       Physical Exam Vitals and nursing note reviewed.  Constitutional:      General: She is not in acute distress.    Appearance: Normal appearance. She is not ill-appearing or toxic-appearing.  HENT:     Head: Normocephalic and atraumatic.  Eyes:     General: No scleral icterus.       Right eye: No discharge.        Left eye: No discharge.     Conjunctiva/sclera: Conjunctivae normal.  Cardiovascular:     Rate and Rhythm: Normal rate.     Pulses: Normal pulses.  Pulmonary:     Effort: Pulmonary effort is normal. No respiratory distress.  Musculoskeletal:     Cervical back: Neck supple.     Right ankle: Swelling (moderate swelling lateral ankle) present. Tenderness present over the lateral malleolus, ATF ligament and base of 5th metatarsal. Decreased range of motion. Normal pulse.     Right foot: Decreased range of motion. Swelling (moderate swelling of dorsal midfoot) and tenderness (Diffuse TTP of metatarsals) present. Normal pulse.  Skin:    General: Skin is dry.  Neurological:     General: No focal deficit present.     Mental Status: She is alert. Mental status is at baseline.     Motor: No weakness.     Gait: Gait abnormal.  Psychiatric:        Mood and Affect: Mood normal.         Behavior: Behavior normal.  Thought Content: Thought content normal.      UC Treatments / Results  Labs (all labs ordered are listed, but only abnormal results are displayed) Labs Reviewed - No data to display  EKG   Radiology No results found.  Procedures Procedures (including critical care time)  Medications Ordered in UC Medications  ketorolac (TORADOL) 30 MG/ML injection 30 mg (30 mg Intramuscular Given 05/02/23 1503)    Initial Impression / Assessment and Plan / UC Course  I have reviewed the triage vital signs and the nursing notes.  Pertinent labs & imaging results that were available during my care of the patient were reviewed by me and considered in my medical decision making (see chart for details).   62 year old female presents for right foot and ankle pain and swelling after getting up from a seated position today.  No fall or trauma.  On exam she does have swelling of the dorsal midfoot, lateral ankle.  Tenderness throughout these regions diffusely.  X-ray of foot and ankle obtained.  Long radiology read times.  I reviewed the images myself and did not see any acute fractures.  Advised patient that we should place her in a cam boot since she has difficulty weightbearing.  She is agreeable.  Declines crutches.  Patient given 30 mg IM ketorolac in clinic for acute pain relief.  Sent Norco to pharmacy and advised her to continue with naproxen at home.  Reviewed RICE guidelines.  Discussed that I will contact her with the results in the next couple of hours.  X-ray negative for fracture.  Attempted to call patient twice but it just went to voicemail.  Final Clinical Impressions(s) / UC Diagnoses   Final diagnoses:  Right foot pain  Swelling of right foot     Discharge Instructions      -Results are pending but upon initial review by myself I did not see any fractures. - Possible sprain of the foot. - We put you in a boot for comfort. - May take NSAID  medication, Tylenol or pain medication as needed.  We gave you an injection of ketorolac in the clinic so if you need any additional indication today avoid NSAIDs - If there is a fracture we will need to stay in the boot and follow-up with orthopedics.  You have a condition requiring you to follow up with Orthopedics so please call one of the following office for appointment:   [facility] Address: [address] [place], [place] [address] Phone: [phone]  [facility] [address], [place], [place] [address] Phone: [phone]  [facility] [address], [place], [place] [address] Phone: [phone]      ED Prescriptions     Medication Sig Dispense Auth. Provider   HYDROcodone-acetaminophen (NORCO) 5-325 MG tablet Take 1 tablet by mouth every 8 (eight) hours as needed for up to 3 days for severe pain. 6 tablet Shirlee Latch, PA-C      I have reviewed the PDMP during this encounter.   Shirlee Latch, PA-C 05/02/23 1724

## 2024-01-27 ENCOUNTER — Ambulatory Visit: Admission: EM | Admit: 2024-01-27 | Discharge: 2024-01-27 | Disposition: A | Discharge status: Home / Self Care

## 2024-01-27 DIAGNOSIS — B349 Viral infection, unspecified: Secondary | ICD-10-CM | POA: Insufficient documentation

## 2024-01-27 DIAGNOSIS — J029 Acute pharyngitis, unspecified: Secondary | ICD-10-CM | POA: Diagnosis not present

## 2024-01-27 DIAGNOSIS — R5383 Other fatigue: Secondary | ICD-10-CM | POA: Insufficient documentation

## 2024-01-27 DIAGNOSIS — H9311 Tinnitus, right ear: Secondary | ICD-10-CM | POA: Diagnosis present

## 2024-01-27 DIAGNOSIS — E890 Postprocedural hypothyroidism: Secondary | ICD-10-CM | POA: Insufficient documentation

## 2024-01-27 DIAGNOSIS — R112 Nausea with vomiting, unspecified: Secondary | ICD-10-CM | POA: Diagnosis present

## 2024-01-27 DIAGNOSIS — Z79890 Hormone replacement therapy (postmenopausal): Secondary | ICD-10-CM | POA: Diagnosis not present

## 2024-01-27 DIAGNOSIS — R197 Diarrhea, unspecified: Secondary | ICD-10-CM | POA: Diagnosis present

## 2024-01-27 LAB — URINALYSIS, ROUTINE W REFLEX MICROSCOPIC
Bilirubin Urine: NEGATIVE
Glucose, UA: NEGATIVE mg/dL
Hgb urine dipstick: NEGATIVE
Ketones, ur: NEGATIVE mg/dL
Nitrite: NEGATIVE
Protein, ur: NEGATIVE mg/dL
Specific Gravity, Urine: 1.015 (ref 1.005–1.030)
pH: 7 (ref 5.0–8.0)

## 2024-01-27 LAB — CBC WITH DIFFERENTIAL/PLATELET
Abs Immature Granulocytes: 0.02 10*3/uL (ref 0.00–0.07)
Basophils Absolute: 0.1 10*3/uL (ref 0.0–0.1)
Basophils Relative: 1 %
Eosinophils Absolute: 0.3 10*3/uL (ref 0.0–0.5)
Eosinophils Relative: 4 %
HCT: 44.2 % (ref 36.0–46.0)
Hemoglobin: 15.1 g/dL — ABNORMAL HIGH (ref 12.0–15.0)
Immature Granulocytes: 0 %
Lymphocytes Relative: 28 %
Lymphs Abs: 2 10*3/uL (ref 0.7–4.0)
MCH: 32.1 pg (ref 26.0–34.0)
MCHC: 34.2 g/dL (ref 30.0–36.0)
MCV: 93.8 fL (ref 80.0–100.0)
Monocytes Absolute: 0.6 10*3/uL (ref 0.1–1.0)
Monocytes Relative: 8 %
Neutro Abs: 4.2 10*3/uL (ref 1.7–7.7)
Neutrophils Relative %: 59 %
Platelets: 330 10*3/uL (ref 150–400)
RBC: 4.71 MIL/uL (ref 3.87–5.11)
RDW: 12.5 % (ref 11.5–15.5)
WBC: 7.1 10*3/uL (ref 4.0–10.5)
nRBC: 0 % (ref 0.0–0.2)

## 2024-01-27 LAB — COMPREHENSIVE METABOLIC PANEL WITH GFR
ALT: 23 U/L (ref 0–44)
AST: 22 U/L (ref 15–41)
Albumin: 4.3 g/dL (ref 3.5–5.0)
Alkaline Phosphatase: 68 U/L (ref 38–126)
Anion gap: 8 (ref 5–15)
BUN: 20 mg/dL (ref 8–23)
CO2: 27 mmol/L (ref 22–32)
Calcium: 9.8 mg/dL (ref 8.9–10.3)
Chloride: 102 mmol/L (ref 98–111)
Creatinine, Ser: 0.86 mg/dL (ref 0.44–1.00)
GFR, Estimated: >60 mL/min (ref >60)
Glucose, Bld: 90 mg/dL (ref 70–99)
Potassium: 4.8 mmol/L (ref 3.5–5.1)
Sodium: 137 mmol/L (ref 135–145)
Total Bilirubin: 0.4 mg/dL (ref 0.0–1.2)
Total Protein: 7.9 g/dL (ref 6.5–8.1)

## 2024-01-27 LAB — URINALYSIS, MICROSCOPIC (REFLEX)

## 2024-01-27 LAB — GROUP A STREP BY PCR: Group A Strep by PCR: NOT DETECTED

## 2024-01-27 MED ORDER — ONDANSETRON 4 MG PO TBDP: 4.0000 mg | ORAL_TABLET | Freq: Three times a day (TID) | ORAL | 0 refills | Status: AC | PRN

## 2024-01-27 NOTE — Discharge Instructions (Signed)
 VIRAL ILLNESS: You may take Tylenol  for pain relief. Use medications as directed including antiemetics and antidiarrheal medications if suggested or prescribed. You should increase fluids and electrolytes as well as rest over these next several days. If you have any questions or concerns, or if your symptoms are not improving or if especially if they acutely worsen, please call or stop back to the clinic immediately and we will be happy to help you or go to the ER   RED FLAGS: Seek immediate further care if: symptoms remain the same or worsen over the next 3-7 days, you are unable to keep fluids down, you see blood or mucus in your stool, you vomit black or dark red material, you have a fever of 101.F or higher, you have localized and/or persistent abdominal pain

## 2024-01-27 NOTE — ED Provider Notes (Signed)
 [facility]    CSN: [id number] Arrival date & time: 01/27/24  1545      History   Chief Complaint Chief Complaint  Patient presents with   Fatigue   Ear Problem   Nausea    HPI Allison Coffey is a 63 y.o. female presenting for generalized fatigue, sleepiness, buzzing sensation in the right ear, nausea and scratchy throat.  Onset of symptoms was about 5 days ago.  She reports having nausea/vomiting and diarrhea for the first 2 to 3 days but vomiting and diarrhea resolved.  She denies ever having any fevers, abdominal pain, cough, congestion, chest pain, shortness of breath.  No palpitations.  Denies urinary symptoms.  Patient says she is becoming increasingly fatigued today.  History of hypothyroidism and is status post thyroid  ablation.  Patient reports having her thyroid  levels checked about 6 months ago and says they were normal.   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   CHOLECYSTECTOMY  1989   ROTATOR CUFF REPAIR Left 2012    OB History   No obstetric history on file.      Home Medications    Prior to Admission medications   Medication Sig Start Date End Date Taking? Authorizing Provider  hydrochlorothiazide   (HYDRODIURIL ) 25 MG tablet Take 1 tablet by mouth daily. 09/17/23 09/16/24 Yes [provider]  amLODipine  (NORVASC ) 5 MG tablet TAKE 1 TABLET BY MOUTH EVERY DAY 03/22/20  Yes Thersia Flax, MD  baclofen  (LIORESAL ) 10 MG tablet Take 10 mg by mouth daily.   Yes [provider]  buPROPion  (WELLBUTRIN  XL) 150 MG 24 hr tablet TAKE 1 TABLET BY MOUTH EVERY DAY 06/15/19  Yes Tullo, Teresa L, MD  fluticasone  (FLONASE ) 50 MCG/ACT nasal spray Place 2 sprays into both nostrils daily. 03/20/23   Ethlyn Herd, MD  levothyroxine  (SYNTHROID ) 88 MCG tablet TAKE 1 TABLET BY MOUTH EVERY DAY BEFORE BREAKFAST 04/17/22  Yes Webb, Padonda B, FNP  losartan  (COZAAR ) 100 MG tablet TAKE 1/2 TABLET BY MOUTH EVERY DAY 09/15/19  Yes Tullo, Teresa L, MD  naproxen  (NAPROSYN ) 500 MG tablet Take 1 tablet (500 mg total) by mouth 2 (two) times daily. 03/20/23   Mortenson, Ashley, MD  venlafaxine XR (EFFEXOR-XR) 75 MG 24 hr capsule Take 75 mg by mouth daily.   Yes [provider]  metoprolol  succinate (TOPROL -XL) 25 MG 24 hr tablet TAKE 1 TABLET BY MOUTH EVERY DAY 07/07/19 08/16/20  Thersia Flax, MD  temazepam  (RESTORIL ) 22.5 MG capsule TAKE 1 CAPSULE (22.5 MG TOTAL) BY MOUTH AT BEDTIME AS NEEDED FOR SLEEP. 07/07/19 08/16/20  Thersia Flax, MD    Family History Family History  Problem Relation Age of Onset   Heart disease Mother 9  Stroke Mother 73   Stroke Maternal Grandmother    Heart disease Maternal Grandmother    Heart disease Paternal Grandmother    Heart disease Paternal Grandfather    Healthy Father    Breast cancer Neg Hx     Social History Social History   Tobacco Use   Smoking status: Former    Current packs/day: 0.00    Types: Cigarettes    Quit date: 01/19/1995    Years since quitting: 29.0   Smokeless tobacco: Never  Vaping Use   Vaping status: Never Used  Substance Use Topics   Alcohol  use: Yes    Alcohol /week: 0.0 standard drinks of alcohol     Comment:  occasionally   Drug use: No     Allergies   Patient has no known allergies.   Review of Systems Review of Systems  Constitutional:  Positive for fatigue. Negative for chills, diaphoresis and fever.  HENT:  Positive for sore throat. Negative for congestion, ear discharge, ear pain, facial swelling, rhinorrhea and sinus pain.   Respiratory:  Negative for cough and shortness of breath.   Cardiovascular:  Negative for chest pain.  Gastrointestinal:  Positive for diarrhea and nausea. Negative for abdominal pain and vomiting.  Genitourinary:  Negative for difficulty urinating, dysuria and urgency.  Musculoskeletal:  Positive for myalgias.  Skin:  Negative for rash.  Neurological:  Negative for dizziness, weakness and headaches.  Hematological:  Negative for adenopathy.     Physical Exam Triage Vital Signs ED Triage Vitals  Encounter Vitals Group     BP      Systolic BP Percentile      Diastolic BP Percentile      Pulse      Resp      Temp      Temp src      SpO2      Weight      Height      Head Circumference      Peak Flow      Pain Score      Pain Loc      Pain Education      Exclude from Growth Chart    No data found.  Updated Vital Signs BP (!) 131/92 (BP Location: Left Arm)   Pulse 70   Temp 98.7 F (37.1 C) (Oral)   Resp 16   Ht 5\' 3"  (1.6 m)   Wt 129 lb (58.5 kg)   SpO2 97%   BMI 22.85 kg/m     Physical Exam Vitals and nursing note reviewed.  Constitutional:      General: She is not in acute distress.    Appearance: Normal appearance. She is not ill-appearing or toxic-appearing.  HENT:     Head: Normocephalic and atraumatic.     Right Ear: Tympanic membrane, ear canal and external ear normal.     Left Ear: Tympanic membrane, ear canal and external ear normal.     Nose: Nose normal.     Mouth/Throat:     Mouth: Mucous membranes are moist.     Pharynx: Oropharynx is clear. Posterior oropharyngeal erythema (very mild) present.  Eyes:      General: No scleral icterus.       Right eye: No discharge.        Left eye: No discharge.     Conjunctiva/sclera: Conjunctivae normal.  Cardiovascular:     Rate and Rhythm: Normal rate and regular rhythm.     Heart sounds: Normal heart sounds.  Pulmonary:     Effort: Pulmonary effort is normal. No respiratory distress.     Breath sounds: Normal breath sounds.  Abdominal:     Palpations: Abdomen is soft.     Tenderness: There is no abdominal tenderness. There is no right CVA tenderness or left CVA tenderness.  Musculoskeletal:     Cervical back: Neck supple.  Skin:    General: Skin is dry.  Neurological:     General: No focal deficit present.     Mental Status: She is alert. Mental status is at baseline.     Motor: No weakness.     Gait: Gait normal.  Psychiatric:        Mood and Affect: Mood normal.        Behavior: Behavior normal.      UC Treatments / Results  Labs (all labs ordered are listed, but only abnormal results are displayed) Labs Reviewed  CBC WITH DIFFERENTIAL/PLATELET - Abnormal; Notable for the following components:      Result Value   Hemoglobin 15.1 (*)    All other components within normal limits  URINALYSIS, ROUTINE W REFLEX MICROSCOPIC - Abnormal; Notable for the following components:   APPearance CLOUDY (*)    Leukocytes,Ua TRACE (*)    All other components within normal limits  URINALYSIS, MICROSCOPIC (REFLEX) - Abnormal; Notable for the following components:   Bacteria, UA FEW (*)    All other components within normal limits  GROUP A STREP BY PCR  URINE CULTURE  COMPREHENSIVE METABOLIC PANEL WITH GFR  THYROID  PANEL WITH TSH    EKG   Radiology No results found.  Procedures Procedures (including critical care time)  Medications Ordered in UC Medications - No data to display  Initial Impression / Assessment and Plan / UC Course  I have reviewed the triage vital signs and the nursing notes.  Pertinent labs & imaging results that  were available during my care of the patient were reviewed by me and considered in my medical decision making (see chart for details).   63 year old female presents for 5-day history of illness.  Reports having vomiting and diarrhea for the first 2 to 3 days but has resolved.  Continues to have fatigue, slight bodyaches, nausea and developed a scratchy throat today.  No fever, cough, congestion, chest pain, shortness of breath, abdominal pain, urinary symptoms.  Vitals are stable and she is overall well-appearing.  No acute distress.  On evaluation he does have an ear infection.  No nasal congestion.  Very slight erythema posterior proximal.  Chest clear.  Heart regular rate rhythm.  Abdomen soft and nontender.  PCR strep was obtained.  She declined COVID/flu testing.  Will also obtain CBC, CMP, urinalysis and thyroid  panel for generalized fatigue which she thinks may not be due to polyps as she says she does not "feel very sick."  CBC and CMP without any significant findings.  Strep negative.  Urinalysis shows cloudy urine with trace leukocytes and bacteria.  Will send urine for culture and if positive treat for UTI.  Explained that symptoms most likely due to viral gastroenteritis.  Care is supportive.  Encourage increasing fluids.  Sent Zofran  to pharmacy.  Reviewed that we will contact her with any abnormal results of the thyroid  test or urine culture.  Reviewed returning for fever or acute worsening of symptoms.  ED precautions discussed.  She declines a work note or AVS.   Final Clinical Impressions(s) / UC Diagnoses   Final diagnoses:  Viral  illness  Nausea vomiting and diarrhea  Sore throat  Other fatigue     Discharge Instructions      VIRAL ILLNESS: You may take Tylenol  for pain relief. Use medications as directed including antiemetics and antidiarrheal medications if suggested or prescribed. You should increase fluids and electrolytes as well as rest over these next several  days. If you have any questions or concerns, or if your symptoms are not improving or if especially if they acutely worsen, please call or stop back to the clinic immediately and we will be happy to help you or go to the ER   RED FLAGS: Seek immediate further care if: symptoms remain the same or worsen over the next 3-7 days, you are unable to keep fluids down, you see blood or mucus in your stool, you vomit black or dark red material, you have a fever of 101.F or higher, you have localized and/or persistent abdominal pain     ED Prescriptions   None    PDMP not reviewed this encounter.   Floydene Hy, PA-C 01/27/24 1746

## 2024-01-27 NOTE — ED Triage Notes (Signed)
 Pt c/o fatigue & nausea x6 days & buzzing in R ear which is ongoing. States very tired & no energy.

## 2024-01-28 ENCOUNTER — Ambulatory Visit: Payer: Self-pay

## 2024-01-28 LAB — URINE CULTURE: Culture: <10000 — AB

## 2024-01-29 ENCOUNTER — Ambulatory Visit: Payer: Self-pay

## 2024-01-29 LAB — THYROID PANEL WITH TSH
Free Thyroxine Index: 1.9 (ref 1.2–4.9)
T3 Uptake Ratio: 28 % (ref 24–39)
T4, Total: 6.7 ug/dL (ref 4.5–12.0)
TSH: 1.27 u[IU]/mL (ref 0.450–4.500)

## 2024-05-28 ENCOUNTER — Ambulatory Visit
Admission: EM | Admit: 2024-05-28 | Discharge: 2024-05-28 | Disposition: A | Discharge status: Home / Self Care | Attending: Physician Assistant | Admitting: Physician Assistant

## 2024-05-28 ENCOUNTER — Ambulatory Visit: Payer: Self-pay | Admitting: Physician Assistant

## 2024-05-28 ENCOUNTER — Ambulatory Visit (INDEPENDENT_AMBULATORY_CARE_PROVIDER_SITE_OTHER)

## 2024-05-28 DIAGNOSIS — J019 Acute sinusitis, unspecified: Secondary | ICD-10-CM | POA: Diagnosis not present

## 2024-05-28 DIAGNOSIS — R5383 Other fatigue: Secondary | ICD-10-CM | POA: Diagnosis not present

## 2024-05-28 DIAGNOSIS — R052 Subacute cough: Secondary | ICD-10-CM | POA: Diagnosis not present

## 2024-05-28 DIAGNOSIS — J209 Acute bronchitis, unspecified: Secondary | ICD-10-CM | POA: Diagnosis not present

## 2024-05-28 MED ORDER — PREDNISONE 20 MG PO TABS: 40.0000 mg | ORAL_TABLET | Freq: Every day | ORAL | 0 refills | Status: AC

## 2024-05-28 MED ORDER — DOXYCYCLINE HYCLATE 100 MG PO CAPS: 100.0000 mg | ORAL_CAPSULE | Freq: Two times a day (BID) | ORAL | 0 refills | Status: AC

## 2024-05-28 MED ORDER — PROMETHAZINE-DM 6.25-15 MG/5ML PO SYRP: 5.0000 mL | ORAL_SOLUTION | Freq: Four times a day (QID) | ORAL | 0 refills | Status: AC | PRN

## 2024-05-28 NOTE — ED Triage Notes (Signed)
 Cough, congestion, headache, sore throat, ear fullness, fatigue x 3 weeks. Taking amoxicillin , prescribed cough syrup and nyquil with no relief of symptoms.

## 2024-05-28 NOTE — ED Provider Notes (Signed)
 [facility]    CSN: [id number] Arrival date & time: 05/28/24  0831      History   Chief Complaint Chief Complaint  Patient presents with   Cough    HPI Allison Coffey is a 63 y.o. female presenting for 3-week history of cough, congestion, fatigue, sinus pressure, bilateral ear fullness and sore throat.  Patient put herself on amoxicillin  2 days after onset of symptoms.  States she has a lot of amoxicillin  at home and thought that is what she would be given anyway so she was not seen and evaluated by provider.  She took the medication for 2 weeks and says it has not helped at all.  She reports also taking multiple over-the-counter cough medications without relief.  She denies fever, chest pain and shortness of breath. Patient has no history of COPD or asthma and is a non-smoker.  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   CHOLECYSTECTOMY  1989   ROTATOR CUFF REPAIR Left 2012    OB History   No obstetric history on file.      Home Medications    Prior to Admission medications   Medication Sig Start Date End Date Taking? Authorizing Provider  amLODipine  (NORVASC ) 5 MG tablet TAKE 1 TABLET BY MOUTH EVERY DAY  03/22/20  Yes Marylynn Verneita CROME, MD  baclofen  (LIORESAL ) 10 MG tablet Take 10 mg by mouth daily.   Yes [provider]  doxycycline (VIBRAMYCIN) 100 MG capsule Take 1 capsule (100 mg total) by mouth 2 (two) times daily for 7 days. 05/28/24 06/04/24 Yes Arvis Jolan NOVAK, PA-C  hydrochlorothiazide  (HYDRODIURIL ) 25 MG tablet Take 1 tablet by mouth daily. 09/17/23 09/16/24 Yes [provider]  levothyroxine  (SYNTHROID ) 88 MCG tablet TAKE 1 TABLET BY MOUTH EVERY DAY BEFORE BREAKFAST 04/17/22  Yes Webb, Padonda B, FNP  predniSONE  (DELTASONE ) 20 MG tablet Take 2 tablets (40 mg total) by mouth daily for 5 days. 05/28/24 06/02/24 Yes Arvis Jolan B, PA-C  promethazine -dextromethorphan (PROMETHAZINE -DM) 6.25-15 MG/5ML syrup Take 5 mLs by mouth 4 (four) times daily as needed. 05/28/24  Yes Arvis Jolan B, PA-C  buPROPion  (WELLBUTRIN  XL) 150 MG 24 hr tablet TAKE 1 TABLET BY MOUTH EVERY DAY 06/15/19   Marylynn Verneita CROME, MD  fluticasone  (FLONASE ) 50 MCG/ACT nasal spray Place 2 sprays into both nostrils daily. 03/20/23   Van Knee, MD  losartan  (COZAAR ) 100 MG tablet TAKE 1/2 TABLET BY MOUTH EVERY DAY 09/15/19   Marylynn Verneita CROME, MD  naproxen  (NAPROSYN ) 500 MG tablet Take 1 tablet (500 mg total) by mouth 2 (two) times daily. 03/20/23   Van Knee, MD  ondansetron  (ZOFRAN -ODT) 4 MG disintegrating tablet Take 1 tablet (4 mg total) by mouth every 8 (eight)  hours as needed. 01/27/24   Arvis Jolan NOVAK, PA-C  venlafaxine XR (EFFEXOR-XR) 75 MG 24 hr capsule Take 75 mg by mouth daily.    [provider]  metoprolol  succinate (TOPROL -XL) 25 MG 24 hr tablet TAKE 1 TABLET BY MOUTH EVERY DAY 07/07/19 08/16/20  Marylynn Verneita CROME, MD  temazepam  (RESTORIL ) 22.5 MG capsule TAKE 1 CAPSULE (22.5 MG TOTAL) BY MOUTH AT BEDTIME AS NEEDED FOR SLEEP. 07/07/19 08/16/20  Marylynn Verneita CROME, MD    Family History Family History  Problem Relation Age of Onset   Heart disease Mother 82   Stroke Mother 15   Stroke  Maternal Grandmother    Heart disease Maternal Grandmother    Heart disease Paternal Grandmother    Heart disease Paternal Grandfather    Healthy Father    Breast cancer Neg Hx     Social History Social History   Tobacco Use   Smoking status: Former    Current packs/day: 0.00    Types: Cigarettes    Quit date: 01/19/1995    Years since quitting: 29.3   Smokeless tobacco: Never  Vaping Use   Vaping status: Never Used  Substance Use Topics   Alcohol  use: Yes    Alcohol /week: 0.0 standard drinks of alcohol     Comment: occasionally   Drug use: No     Allergies   Trazodone    Review of Systems Review of Systems  Constitutional:  Positive for fatigue. Negative for chills, diaphoresis and fever.  HENT:  Positive for congestion, ear pain, rhinorrhea, sinus pressure, sinus pain and sore throat.   Respiratory:  Positive for cough. Negative for shortness of breath and wheezing.   Cardiovascular:  Negative for chest pain.  Gastrointestinal:  Negative for abdominal pain, nausea and vomiting.  Musculoskeletal:  Negative for arthralgias and myalgias.  Skin:  Negative for rash.  Neurological:  Positive for headaches. Negative for weakness.  Hematological:  Negative for adenopathy.     Physical Exam Triage Vital Signs ED Triage Vitals  Encounter Vitals Group     BP 05/28/24 0853 (!) 118/90     Girls Systolic BP Percentile --      Girls Diastolic BP Percentile --      Boys Systolic BP Percentile --      Boys Diastolic BP Percentile --      Pulse Rate 05/28/24 0853 96     Resp 05/28/24 0853 16     Temp 05/28/24 0853 98.3 F (36.8 C)     Temp Source 05/28/24 0853 Oral     SpO2 05/28/24 0853 100 %     Weight --      Height --      Head Circumference --      Peak Flow --      Pain Score 05/28/24 0855 4     Pain Loc --      Pain Education --      Exclude from Growth Chart --    No data found.  Updated Vital Signs BP (!) 118/90 (BP Location: Left Arm)   Pulse 96    Temp 98.3 F (36.8 C) (Oral)   Resp 16   SpO2 100%       Physical Exam Vitals and nursing note reviewed.  Constitutional:      General: She is not in acute distress.    Appearance: Normal appearance. She is not ill-appearing or toxic-appearing.     Comments: Patient coughs frequently   HENT:     Head:  Normocephalic and atraumatic.     Right Ear: Tympanic membrane, ear canal and external ear normal.     Left Ear: Tympanic membrane, ear canal and external ear normal.     Nose: Congestion present.     Mouth/Throat:     Mouth: Mucous membranes are moist.     Pharynx: Oropharynx is clear. Posterior oropharyngeal erythema (mild with PND) present.  Eyes:     General: No scleral icterus.       Right eye: No discharge.        Left eye: No discharge.     Conjunctiva/sclera: Conjunctivae normal.  Cardiovascular:     Rate and Rhythm: Normal rate and regular rhythm.     Heart sounds: Normal heart sounds.  Pulmonary:     Effort: Pulmonary effort is normal. No respiratory distress.     Breath sounds: Normal breath sounds.  Musculoskeletal:     Cervical back: Neck supple.  Skin:    General: Skin is dry.  Neurological:     General: No focal deficit present.     Mental Status: She is alert. Mental status is at baseline.     Motor: No weakness.     Gait: Gait normal.  Psychiatric:        Mood and Affect: Mood normal.        Behavior: Behavior normal.      UC Treatments / Results  Labs (all labs ordered are listed, but only abnormal results are displayed) Labs Reviewed - No data to display  EKG   Radiology DG Chest 2 View Result Date: 05/28/2024 CLINICAL DATA:  Cough and congestion. EXAM: CHEST - 2 VIEW COMPARISON:  11/28/2010 FINDINGS: The heart size and mediastinal contours are within normal limits. There is no evidence of pulmonary edema, consolidation, pneumothorax, nodule or pleural fluid. The visualized skeletal structures are unremarkable. IMPRESSION: No active  cardiopulmonary disease. Electronically Signed   By: Marcey Moan M.D.   On: 05/28/2024 09:52    Procedures Procedures (including critical care time)  Medications Ordered in UC Medications - No data to display  Initial Impression / Assessment and Plan / UC Course  I have reviewed the triage vital signs and the nursing notes.  Pertinent labs & imaging results that were available during my care of the patient were reviewed by me and considered in my medical decision making (see chart for details).   63 y/o female presents for 3 week history of cough, congestion, and sinus pain. Has taken amoxicillin  x 2 weeks without relief. Also tried OTC meds. No fever or shortness of breath.   Patient is afebrile. Ill appearing but non toxic. Coughs frequently. She has nasal congestion, mild erythema of posterior pharynx with PND. Chest is clear. Heart RRR.   CXR ordered. Wet read negative.   Acute bronchitis/sinusitis. Advised patient symptoms are likely viral and can last up to 6 weeks. However, there is possibility for bacterial infection that plain amoxicillin  is not strong enough to treat. Will treat at this time with doxycycline, prednisone , and promethazine  DM. Reviewed return and follow up precautions.   CXR over read negative.  Acute illness with systemic symptoms.    Final Clinical Impressions(s) / UC Diagnoses   Final diagnoses:  Subacute cough  Acute sinusitis, recurrence not specified, unspecified location  Acute bronchitis, unspecified organism  Other fatigue   Discharge Instructions   None    ED Prescriptions     Medication Sig Dispense Auth. Provider   predniSONE  (DELTASONE ) 20 MG  tablet Take 2 tablets (40 mg total) by mouth daily for 5 days. 10 tablet Arvis Huxley B, PA-C   promethazine -dextromethorphan (PROMETHAZINE -DM) 6.25-15 MG/5ML syrup Take 5 mLs by mouth 4 (four) times daily as needed. 118 mL Arvis Huxley B, PA-C   doxycycline (VIBRAMYCIN) 100 MG capsule Take  1 capsule (100 mg total) by mouth 2 (two) times daily for 7 days. 14 capsule Arvis Huxley NOVAK, PA-C      PDMP not reviewed this encounter.   Arvis Huxley NOVAK, PA-C 05/28/24 1012

## 2024-06-17 ENCOUNTER — Ambulatory Visit
Admission: EM | Admit: 2024-06-17 | Discharge: 2024-06-17 | Disposition: A | Discharge status: Home / Self Care | Attending: Family Medicine | Admitting: Family Medicine

## 2024-06-17 DIAGNOSIS — J22 Unspecified acute lower respiratory infection: Secondary | ICD-10-CM

## 2024-06-17 MED ORDER — PREDNISONE 10 MG PO TABS
ORAL_TABLET | ORAL | 0 refills | Status: AC
Start: 2024-06-17 — End: ?

## 2024-06-17 MED ORDER — LEVOFLOXACIN 500 MG PO TABS: 500.0000 mg | ORAL_TABLET | Freq: Every day | ORAL | 0 refills | Status: AC

## 2024-06-17 NOTE — ED Provider Notes (Signed)
 [facility]    CSN: [id number] Arrival date & time: 06/17/24  1523      History   Chief Complaint Chief Complaint  Patient presents with   Cough    HPI Allison Coffey is a 63 y.o. female.   HPI  History obtained from the patient. Allison Coffey presents for productive cough, body aches and chest congestin for the past 6 weeks.  She was seen here earlier this month and was prescribed steroids and antibiotics which helped but symptoms never really went away.  When she moves a lot she gets dizzy and somewhat short of breathe. No fever. The past 2 days she has been having chills.  She hasn't taken her temperature. Has nausea and decreased appetite. No vomiting or diarrhea. Everything hurts.  No known sick contacts.   No history of asthma. Denies smoking or vaping.      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   CHOLECYSTECTOMY  1989   ROTATOR CUFF REPAIR Left 2012    OB History   No obstetric history on file.      Home Medications    Prior to Admission medications   Medication Sig Start Date End Date Taking? Authorizing Provider  amLODipine  (NORVASC ) 5 MG tablet TAKE 1 TABLET BY MOUTH EVERY DAY 03/22/20  Yes  Marylynn Verneita CROME, MD  buPROPion  (WELLBUTRIN  XL) 150 MG 24 hr tablet TAKE 1 TABLET BY MOUTH EVERY DAY 06/15/19  Yes Tullo, Teresa L, MD  hydrochlorothiazide  (HYDRODIURIL ) 25 MG tablet Take 1 tablet by mouth daily. 09/17/23 09/16/24 Yes [provider]  levofloxacin  (LEVAQUIN ) 500 MG tablet Take 1 tablet (500 mg total) by mouth daily. 06/17/24  Yes Mindel Friscia, DO  levothyroxine  (SYNTHROID ) 88 MCG tablet TAKE 1 TABLET BY MOUTH EVERY DAY BEFORE BREAKFAST 04/17/22  Yes Webb, Padonda B, FNP  predniSONE  (DELTASONE ) 10 MG tablet Take 6 tabs by mouth daily for 2 days, then 5 tabs for 2  days, then 4 tabs for 2 days, then 3 tabs for 2 days, then 2 tabs for 2 days, then 1 tab for 2 days. 06/17/24  Yes Ticia Virgo, DO  baclofen  (LIORESAL ) 10 MG tablet Take 10 mg by mouth daily.    [provider]  fluticasone  (FLONASE ) 50 MCG/ACT nasal spray Place 2 sprays into both nostrils daily. 03/20/23   Van Knee, MD  losartan  (COZAAR ) 100 MG tablet TAKE 1/2 TABLET BY MOUTH EVERY DAY 09/15/19   Marylynn Verneita CROME, MD  naproxen  (NAPROSYN ) 500 MG tablet Take 1 tablet (500 mg total) by mouth 2 (two) times daily. 03/20/23   Van Knee, MD  ondansetron  (ZOFRAN -ODT) 4 MG disintegrating tablet Take 1 tablet (4 mg total) by mouth every 8 (eight) hours as needed. 01/27/24  Arvis Jolan NOVAK, PA-C  promethazine -dextromethorphan (PROMETHAZINE -DM) 6.25-15 MG/5ML syrup Take 5 mLs by mouth 4 (four) times daily as needed. 05/28/24   Arvis Jolan NOVAK, PA-C  venlafaxine XR (EFFEXOR-XR) 75 MG 24 hr capsule Take 75 mg by mouth daily.    [provider]  metoprolol  succinate (TOPROL -XL) 25 MG 24 hr tablet TAKE 1 TABLET BY MOUTH EVERY DAY 07/07/19 08/16/20  Marylynn Verneita CROME, MD  temazepam  (RESTORIL ) 22.5 MG capsule TAKE 1 CAPSULE (22.5 MG TOTAL) BY MOUTH AT BEDTIME AS NEEDED FOR SLEEP. 07/07/19 08/16/20  Marylynn Verneita CROME, MD    Family History Family History  Problem Relation Age of Onset   Heart disease  Mother 86   Stroke Mother 43   Stroke Maternal Grandmother    Heart disease Maternal Grandmother    Heart disease Paternal Grandmother    Heart disease Paternal Grandfather    Healthy Father    Breast cancer Neg Hx     Social History Social History   Tobacco Use   Smoking status: Former    Current packs/day: 0.00    Types: Cigarettes    Quit date: 01/19/1995    Years since quitting: 29.4   Smokeless tobacco: Never  Vaping Use   Vaping status: Never Used  Substance Use Topics   Alcohol  use: Yes    Alcohol /week: 0.0 standard drinks of alcohol     Comment: occasionally   Drug use: No     Allergies   Trazodone    Review of Systems Review of Systems: negative unless otherwise stated in HPI.      Physical Exam Triage Vital Signs ED Triage Vitals  Encounter Vitals Group     BP      Girls Systolic BP Percentile      Girls Diastolic BP Percentile      Boys Systolic BP Percentile      Boys Diastolic BP Percentile      Pulse      Resp      Temp      Temp src      SpO2      Weight      Height      Head Circumference      Peak Flow      Pain Score      Pain Loc      Pain Education      Exclude from Growth Chart    No data found.  Updated Vital Signs BP 103/73 (BP Location: Right Arm)   Pulse 87   Temp 98.7 F (37.1 C) (Oral)   Resp 19   Wt 58.5 kg   SpO2 96%   BMI 22.85 kg/m   Visual Acuity Right Eye Distance:   Left Eye Distance:   Bilateral Distance:    Right Eye Near:   Left Eye Near:    Bilateral Near:     Physical Exam GEN:     alert, non-toxic appearing female in no distress    HENT:  mucus membranes moist, oropharyngeal without lesions or erythema, no tonsillar hypertrophy or exudates,, bilateral TM normal EYES:   no scleral injection or discharge NECK:  normal ROM, no meningismus   RESP:  no increased work of breathing, scattered rhonchi bilaterally CVS:   regular rate and rhythm Skin:   warm and dry, no rash on visible skin    UC  Treatments / Results  Labs (all labs ordered are listed, but only abnormal results are displayed) Labs Reviewed - No data to  display  EKG   Radiology No results found.   Procedures Procedures (including critical care time)  Medications Ordered in UC Medications - No data to display  Initial Impression / Assessment and Plan / UC Course  I have reviewed the triage vital signs and the nursing notes.  Pertinent labs & imaging results that were available during my care of the patient were reviewed by me and considered in my medical decision making (see chart for details).      Pt is a 63 y.o. female who presents for 6 weeks of cough that is not improving.  Karianna is  afebrile here without recent antipyretics. Satting well on room air. Overall pt is  non-toxic appearing, well hydrated, without respiratory distress. Pulmonary exam is remarkable for rhonchi that clear with cough.  After shared decision making, we will not pursue chest x-ray or labs at this time. CXR on 05/28/24 here was normal. Pt treated with doxycycline and prednisone  with some improvement.  COVID  and influenza testing deferred due to length of symptoms.   Treat acute bronchitis with long taper steroids and Levaquin  as below.   Typical duration of symptoms discussed. Return and ED precautions given and patient voiced understanding.   Discussed MDM, treatment plan and plan for follow-up with patient who agrees with plan.      Final Clinical Impressions(s) / UC Diagnoses   Final diagnoses:  Lower respiratory tract infection     Discharge Instructions      Stop by the pharmacy to pick up your prescriptions.  Follow up with your primary care provider or return to the urgent care, if not improving for repeat chest xray and labs that was declined today. You may need to see a lung doctor, if not improving.      ED Prescriptions     Medication Sig Dispense Auth. Provider   levofloxacin  (LEVAQUIN ) 500 MG  tablet Take 1 tablet (500 mg total) by mouth daily. 7 tablet Twyla Dais, DO   predniSONE  (DELTASONE ) 10 MG tablet Take 6 tabs by mouth daily for 2 days, then 5 tabs for 2  days, then 4 tabs for 2 days, then 3 tabs for 2 days, then 2 tabs for 2 days, then 1 tab for 2 days. 42 tablet Juliene Kirsh, DO      PDMP not reviewed this encounter.   Maezie Justin, DO 06/17/24 1553

## 2024-06-17 NOTE — Discharge Instructions (Addendum)
 Stop by the pharmacy to pick up your prescriptions.  Follow up with your primary care provider or return to the urgent care, if not improving for repeat chest xray and labs that was declined today. You may need to see a lung doctor, if not improving.

## 2024-06-17 NOTE — ED Triage Notes (Signed)
 Sx x 6 weeks  Still having sx from last visit.   Cough,bilateral ear pain,headache,fatigue
